# Patient Record
Sex: Male | Born: 1935 | Race: White | Hispanic: No | Marital: Married | Smoking: Never smoker
Health system: Southern US, Community
[De-identification: ages and names within clinical notes are randomized; demographics above are authoritative.]

## PROBLEM LIST (undated history)

## (undated) DIAGNOSIS — I10 Essential (primary) hypertension: Secondary | ICD-10-CM

## (undated) DIAGNOSIS — E7800 Pure hypercholesterolemia, unspecified: Secondary | ICD-10-CM

## (undated) DIAGNOSIS — K219 Gastro-esophageal reflux disease without esophagitis: Secondary | ICD-10-CM

## (undated) DIAGNOSIS — Z972 Presence of dental prosthetic device (complete) (partial): Secondary | ICD-10-CM

## (undated) DIAGNOSIS — N400 Enlarged prostate without lower urinary tract symptoms: Secondary | ICD-10-CM

## (undated) DIAGNOSIS — Z974 Presence of external hearing-aid: Secondary | ICD-10-CM

## (undated) DIAGNOSIS — L570 Actinic keratosis: Secondary | ICD-10-CM

## (undated) DIAGNOSIS — E119 Type 2 diabetes mellitus without complications: Secondary | ICD-10-CM

## (undated) HISTORY — PX: REPLACEMENT TOTAL KNEE: SUR1224

## (undated) HISTORY — DX: Actinic keratosis: L57.0

---

## 2004-08-25 ENCOUNTER — Emergency Department: Payer: Self-pay | Admitting: Emergency Medicine

## 2004-09-10 ENCOUNTER — Emergency Department: Payer: Self-pay | Admitting: Unknown Physician Specialty

## 2006-07-28 ENCOUNTER — Ambulatory Visit: Payer: Self-pay | Admitting: Gastroenterology

## 2011-11-03 ENCOUNTER — Ambulatory Visit: Payer: Self-pay | Admitting: Gastroenterology

## 2012-06-17 ENCOUNTER — Ambulatory Visit: Payer: Self-pay | Admitting: Podiatry

## 2013-01-06 ENCOUNTER — Ambulatory Visit: Payer: Self-pay | Admitting: General Practice

## 2013-01-17 ENCOUNTER — Ambulatory Visit: Payer: Self-pay | Admitting: General Practice

## 2013-02-28 ENCOUNTER — Ambulatory Visit: Payer: Self-pay | Admitting: General Practice

## 2013-02-28 DIAGNOSIS — I10 Essential (primary) hypertension: Secondary | ICD-10-CM

## 2013-02-28 LAB — MRSA PCR SCREENING

## 2013-02-28 LAB — BASIC METABOLIC PANEL
Anion Gap: 3 — ABNORMAL LOW (ref 7–16)
BUN: 18 mg/dL (ref 7–18)
Chloride: 108 mmol/L — ABNORMAL HIGH (ref 98–107)
Co2: 28 mmol/L (ref 21–32)
Creatinine: 0.99 mg/dL (ref 0.60–1.30)
EGFR (African American): >60
EGFR (Non-African Amer.): >60
Osmolality: 282 (ref 275–301)
Potassium: 4.7 mmol/L (ref 3.5–5.1)

## 2013-02-28 LAB — URINALYSIS, COMPLETE
Ketone: NEGATIVE
Nitrite: NEGATIVE
Ph: 5 (ref 4.5–8.0)
RBC,UR: 1 /HPF (ref 0–5)
Squamous Epithelial: <1

## 2013-02-28 LAB — APTT: Activated PTT: 37.1 secs — ABNORMAL HIGH (ref 23.6–35.9)

## 2013-02-28 LAB — SEDIMENTATION RATE: Erythrocyte Sed Rate: 2 mm/hr (ref 0–20)

## 2013-02-28 LAB — CBC
HGB: 16.2 g/dL (ref 13.0–18.0)
MCHC: 34.8 g/dL (ref 32.0–36.0)
Platelet: 142 10*3/uL — ABNORMAL LOW (ref 150–440)
RDW: 14.3 % (ref 11.5–14.5)

## 2013-02-28 LAB — PROTIME-INR: INR: 1

## 2013-03-07 ENCOUNTER — Inpatient Hospital Stay: Payer: Self-pay | Admitting: General Practice

## 2013-03-08 LAB — BASIC METABOLIC PANEL
BUN: 13 mg/dL (ref 7–18)
Calcium, Total: 7.9 mg/dL — ABNORMAL LOW (ref 8.5–10.1)
Chloride: 106 mmol/L (ref 98–107)
Co2: 26 mmol/L (ref 21–32)
Glucose: 117 mg/dL — ABNORMAL HIGH (ref 65–99)
Potassium: 4.3 mmol/L (ref 3.5–5.1)

## 2013-03-09 LAB — PLATELET COUNT: Platelet: 118 10*3/uL — ABNORMAL LOW (ref 150–440)

## 2013-03-09 LAB — BASIC METABOLIC PANEL
Anion Gap: 5 — ABNORMAL LOW (ref 7–16)
BUN: 11 mg/dL (ref 7–18)
Calcium, Total: 8.2 mg/dL — ABNORMAL LOW (ref 8.5–10.1)
Chloride: 110 mmol/L — ABNORMAL HIGH (ref 98–107)
Co2: 26 mmol/L (ref 21–32)
EGFR (Non-African Amer.): >60
Glucose: 127 mg/dL — ABNORMAL HIGH (ref 65–99)
Osmolality: 282 (ref 275–301)
Potassium: 4.4 mmol/L (ref 3.5–5.1)
Sodium: 141 mmol/L (ref 136–145)

## 2013-06-29 ENCOUNTER — Inpatient Hospital Stay: Payer: Self-pay | Admitting: General Practice

## 2013-06-29 LAB — CBC WITH DIFFERENTIAL/PLATELET
Basophil #: 0 10*3/uL (ref 0.0–0.1)
Basophil %: 0.2 %
EOS ABS: 0.1 10*3/uL (ref 0.0–0.7)
Eosinophil %: 1.2 %
HCT: 46 % (ref 40.0–52.0)
HGB: 15.3 g/dL (ref 13.0–18.0)
LYMPHS ABS: 0.9 10*3/uL — AB (ref 1.0–3.6)
Lymphocyte %: 11.2 %
MCH: 31.1 pg (ref 26.0–34.0)
MCHC: 33.3 g/dL (ref 32.0–36.0)
MCV: 94 fL (ref 80–100)
MONOS PCT: 7.9 %
Monocyte #: 0.7 x10 3/mm (ref 0.2–1.0)
Neutrophil #: 6.6 10*3/uL — ABNORMAL HIGH (ref 1.4–6.5)
Neutrophil %: 79.5 %
Platelet: 144 10*3/uL — ABNORMAL LOW (ref 150–440)
RBC: 4.91 10*6/uL (ref 4.40–5.90)
RDW: 15.3 % — ABNORMAL HIGH (ref 11.5–14.5)
WBC: 8.3 10*3/uL (ref 3.8–10.6)

## 2013-06-29 LAB — HEMOGLOBIN: HGB: 15.2 g/dL (ref 13.0–18.0)

## 2013-07-01 ENCOUNTER — Encounter: Payer: Self-pay | Admitting: Internal Medicine

## 2013-07-10 ENCOUNTER — Encounter: Payer: Self-pay | Admitting: Internal Medicine

## 2013-07-21 LAB — RAPID INFLUENZA A&B ANTIGENS

## 2013-07-28 LAB — HEMOGLOBIN A1C: Hemoglobin A1C: 6.2 % (ref 4.2–6.3)

## 2014-02-02 DIAGNOSIS — N400 Enlarged prostate without lower urinary tract symptoms: Secondary | ICD-10-CM | POA: Insufficient documentation

## 2014-02-02 DIAGNOSIS — E1169 Type 2 diabetes mellitus with other specified complication: Secondary | ICD-10-CM | POA: Insufficient documentation

## 2014-02-02 DIAGNOSIS — E1140 Type 2 diabetes mellitus with diabetic neuropathy, unspecified: Secondary | ICD-10-CM | POA: Insufficient documentation

## 2014-02-02 DIAGNOSIS — J3089 Other allergic rhinitis: Secondary | ICD-10-CM | POA: Insufficient documentation

## 2014-02-02 DIAGNOSIS — E785 Hyperlipidemia, unspecified: Secondary | ICD-10-CM

## 2014-02-02 DIAGNOSIS — K219 Gastro-esophageal reflux disease without esophagitis: Secondary | ICD-10-CM | POA: Insufficient documentation

## 2014-02-02 DIAGNOSIS — I10 Essential (primary) hypertension: Secondary | ICD-10-CM | POA: Insufficient documentation

## 2014-03-12 DIAGNOSIS — Z96651 Presence of right artificial knee joint: Secondary | ICD-10-CM | POA: Insufficient documentation

## 2014-07-30 DIAGNOSIS — Z79899 Other long term (current) drug therapy: Secondary | ICD-10-CM | POA: Insufficient documentation

## 2014-09-01 NOTE — Op Note (Signed)
PATIENT NAME:  Jerry Atkinson, Jerry Atkinson MR#:  [id number] DATE OF BIRTH:  [birth year]  DATE OF PROCEDURE:  03/07/2013  PREOPERATIVE DIAGNOSIS: Degenerative arthrosis of the right knee.   POSTOPERATIVE DIAGNOSIS: Degenerative arthrosis of the right knee.   PROCEDURE PERFORMED: Right total knee arthroplasty using computer-assisted navigation.   SURGEON: Skip Estimable, M.D.   ASSISTANT: Vance Peper, P.A. (required to maintain retraction throughout the procedure).   ANESTHESIA: Spinal.   ESTIMATED BLOOD LOSS: 50 mL.   FLUIDS REPLACED: 1600 mL of crystalloid.   TOURNIQUET TIME: 67 minutes.   DRAINS: Two medium drains for reinfusion system.   SOFT TISSUE RELEASES: Anterior cruciate ligament, posterior cruciate ligament, deep medial collateral ligament, and patellofemoral ligament.   IMPLANTS UTILIZED: DePuy PFC Sigma size 2.5 posterior stabilized femoral component (cemented), size 4 MBT tibial component (cemented), 32 mm three-peg oval dome patella (cemented), and a 10 mm stabilized rotating platform polyethylene insert.   INDICATIONS FOR SURGERY: The patient is an 81 year old gentleman who has been seen for complaints of progressive right knee pain. X-rays demonstrated severe degenerative change in the tricompartmental fascia with relative varus deformity. After discussion of the risks and benefits of surgical intervention, the patient expressed understanding of the risks and benefits and agreed with plans for surgical intervention.   PROCEDURE IN DETAIL: The patient was brought to the operating room and, after adequate spinal anesthesia was achieved, a tourniquet was placed on the patient's upper right thigh. The patient's right knee and leg were cleaned and prepped with alcohol and DuraPrep, draped in the usual sterile fashion. A "timeout" was performed as per usual protocol. The right lower extremity was exsanguinated using an Esmarch, and the tourniquet was inflated to 300 mmHg.   An anterior  longitudinal incision was made followed by a standard mid vastus approach. The deep fibers of the medial collateral ligament were elevated in a subperiosteal fashion off the medial flare of the tibia so as to maintain a continuous soft-tissue sleeve. The patella was subluxed laterally and the patellofemoral ligament was incised.   Inspection of the knee demonstrated severe degenerative changes with full-thickness loss of articular cartilage noted primarily along the medial compartment. Osteophytes were debrided using a rongeur. Anterior and posterior cruciate ligaments were excised. Two 4.0 mm Schanz pins were inserted into the femur and into the tibia for attachment of the array of trackers used for computer-assisted navigation.   Hip center was identified using circumduction technique. Distal landmarks were mapped using the computer. The distal femur and proximal tibia were mapped using the computer. Distal femoral cutting guide was positioned using computer-assisted navigation so as to achieve a 5-degree distal valgus cut. Cut was performed and verified using the computer. Distal femur was sized and it was felt that a size 2.5 femoral component was appropriate. A size 2.5 cutting guide was positioned and the anterior cut was performed and verified using the computer. This was followed by completion of the posterior and chamfer cuts. Femoral cutting guide for central box was then positioned, and the central box cut was performed.   Attention was then directed to the proximal tibia. Medial and lateral menisci were excised. The extramedullary tibial cutting guide was positioned using computer-assisted navigation so as to achieve a 0-degree varus valgus alignment and 0-degree posterior slope. The cut was performed and verified using the computer. The proximal tibia was sized and it was felt that a size 4 tibial tray was appropriate. Tibial and femoral trials were inserted followed by insertion of a 10  mm  polyethylene trial. Good medial and lateral soft-tissue balancing was appreciated both in full extension and in flexion.   Finally, the patella was cut and prepared so as to accommodate a 32 mm three-peg oval dome patella. Patellar trial was placed and the knee was placed through range of motion with excellent patellar tracking appreciated.   Femoral trial was removed. Central post hole for the tibial component was reamed followed by insertion of a keel punch. Tibial trials were then removed. Cut surfaces of bone were irrigated with copious amounts of normal saline with antibiotic solution using pulsatile lavage and then suctioned dry. Polymethyl methacrylate cement with gentamicin was utilized (due to the patient's history of diabetes) and mixed in the usual fashion using a vacuum mixer. Cement was applied to the cut surface of the tibia as well as along the undersurface of a size 4 MBT tibial component.   Tibial component was positioned and impacted into place. Excess cement was removed using [employer] fast streamer. Cement was then applied to the cut surface of the femur as well as along the posterior flanges of a size 2.5 posterior stabilized femoral component. Femoral component was positioned and impacted into place. Excess cement was removed using [employer] fast streamer. A 10 mm polyethylene trial was inserted and the knee was brought in full extension with steady axial compression applied. Finally, cement was applied to the backside of a 32 mm three-peg oval dome patella, and patellar component was positioned and patellar clamp applied. Excess cement was removed using [employer] fast streamer.   After adequate curing of cement, the tourniquet was deflated after a total tourniquet time of 67 minutes. Hemostasis was achieved using electrocautery. The knee was irrigated with copious amounts of normal saline with antibiotic solution using pulsatile lavage and then suctioned dry. The knee was inspected for any residual cement  debris. Then 20 mL of 1.3% Exparel in 40 mL of normal saline was injected along the posterior capsule, medial and lateral gutters, and along the arthrotomy site. A 10 mm stabilized rotating platform polyethylene insert (10 mm) was placed and the knee was placed through a range of motion. Excellent mediolateral soft-tissue balancing was appreciated both in full extension and in flexion. Excellent patellar tracking was appreciated.   Two medium drains were placed in the wound bed and brought out through a separate stab incision to be attached to reinfusion system. The medial parapatellar portion of the incision was reapproximated using interrupted sutures of #1 Vicryl. The subcutaneous tissue was approximated in layers using first #0 Vicryl followed by 2-0 Vicryl. Prior to complete closure, 30 mL of 0.25% Marcaine with epinephrine was injected along the incision line in the subcutaneous tissue. Skin was then approximated using skin staples. Sterile dressing was applied.   The patient tolerated the procedure well. He was transported to the recovery room in stable condition.   ____________________________ Laurice Record. Holley Bouche., MD jph:np D: 03/07/2013 13:39:05 ET T: 03/07/2013 20:56:18 ET JOB#: [id number]  cc: Laurice Record. Holley Bouche., MD, <Dictator> JAMES P Holley Bouche MD ELECTRONICALLY SIGNED 03/13/2013 11:08

## 2014-09-01 NOTE — Discharge Summary (Signed)
PATIENT NAME:  Jerry Atkinson, Jerry Atkinson MR#:  [id number] DATE OF BIRTH:  01-18-1931  DATE OF ADMISSION:  03/07/2013 DATE OF DISCHARGE: 03/10/2013   ADMITTING DIAGNOSIS:  Degenerative arthrosis of the right knee.   DISCHARGE DIAGNOSIS:  Degenerative arthrosis of the right knee.   HISTORY OF PRESENT ILLNESS:  The patient is an 81 year old gentleman who has been followed at [facility] for progression of right knee pain.  The patient had reported a long history of progressive right knee pain.  The pain was noted to be aggravated with activities such as gardening which he had done over the summer prior to the surgery.  He was occasionally having some night pain.  The patient also reported some crepitus as well as start-up stiffness.  He had noticed only modest improvement of his symptoms following a series of Synvisc injections.  The patient had localized most of the pain along the medial aspect at the joint line.  The patient states that the pain had progressed to the point that it was significantly interfering with his activities of daily living.  X-rays taken in [facility] showed narrowing of the medial cartilage space with associated varus alignment.  He was noted to have subchondral sclerosis as well as some osteophyte formation.  There was degenerative changes to the patellofemoral articulation.  After discussion of the risks and benefits of surgical intervention, the patient expressed his understanding of the risks and benefits and agreed for plans for surgical intervention.   PROCEDURE:  Right total knee arthroplasty using computer-assisted navigation.   ANESTHESIA:  Spinal.   SOFT TISSUE RELEASE:  Anterior cruciate ligament, posterior cruciate ligament, deep medial collateral ligaments as well as patellofemoral ligament.   IMPLANTS UTILIZED:  DePuy PFC Sigma size 2.5 posterior stabilized femoral component (cemented), size four MBT tibial component (cemented), 32 mm three pegged oval  dome patella (cemented), and a 10 mm stabilized rotating platform polyethylene insert.   HOSPITAL COURSE:  The patient tolerated the procedure very well.  He had no complications.  He was then taken to the PACU where he was stabilized and then transferred to the orthopedic floor.  The patient began receiving anticoagulation therapy of Lovenox 30 mg subQ q. 12 hours per anesthesia and pharmacy protocol.  He was fitted with Ted stockings bilaterally.  These were allowed to be removed one hour per eight hour shift.  The right one was applied on day two following removal of the Hemovac and dressing change.  The patient was also fitted with the AV-I compression foot pumps bilaterally set at 80 mmHg bilaterally.  The patient has denied any pain to the calf.  There have been no evidence of any DVTs.  Negative Homans sign.  Heels were elevated off the bed using rolled towels.   The patient has denied any chest pain or shortness of breath.  Vital signs have been stable.  He has been afebrile.  Hemodynamically he was stable.  No transfusions were given other than the Autovac transfusions given in the first six hours postoperatively.   Physical therapy was initiated on day one for gait training and transfers.  He has done very well.  Upon being discharged was ambulating greater than 450 feet.  He was able to go up and down four sets of steps.  He was independent with bed to chair transfers.  Range of motion was full extension with flexion being 105 to 110.  Occupational therapy was also initiated on day one for ADL and assistive devices.  The patient's IV, Foley and Hemovac were discontinued on day 2 along with the dressing change.  Polar Care was reapplied maintaining a temperature of 40 to 50 degrees Fahrenheit.  There was no signs of any infection.    The patient is being discharged to home in improved stable condition.  He is to continue weight-bearing as tolerated.  Continue using a walker until cleared by  physical therapy to go to a quad cane.  Continue Ted stockings.  These are to be worn bilaterally during the day.  May remove at night.  Continue Polar Care maintaining a temperature of 40 to 50 degrees Fahrenheit.  Recommend he try to wear this around-the-clock for the first two weeks.  He has a follow-up appointment at [facility] on November the 11th at 1:15.  He is to call the clinic sooner if any temperatures of 101.5 or greater or excessive bleeding.  He is to elevate his heels off the bed.  Recommend that he continue using his incentive spirometer q. 1 hour while awake.  Encouraged cough, deep breathing q. two hours while awake.  Change dressing as needed.  The patient is not to take a shower until the staples are removed.  The patient was given a prescription for oxycodone 5 to 10 mg q. 4 to 6 hours as needed for pain, Tramadol 50 to 100 mg q. 4 to 6 hours as needed for pain and Lovenox 40 mg subQ daily for 14 days, then discontinue and begin taking one 81 mg enteric-coated aspirin.   PAST MEDICAL HISTORY: 1.  Hypertension.  2.  History of myasthenia gravis.  3.  Gastroesophageal reflux disease.  4.  Hypercholesterolemia.  5.  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     ____________________________ Vance Peper, PA jrw:ea D: 03/10/2013 06:47:41 ET T: 03/10/2013 07:02:13 ET JOB#: [id number]  cc: Vance Peper, PA, <Dictator> JON WOLFE PA ELECTRONICALLY SIGNED 03/12/2013 20:52

## 2014-09-02 NOTE — H&P (Signed)
PATIENT NAME:  Jerry Atkinson, Jerry Atkinson MR#:  [id number] DATE OF BIRTH:  Sep 26, 1930  DATE OF ADMISSION:  06/29/2013  CONSULTING PHYSICIAN: Jeneen Rinks P. Holley Bouche., M.D.   CHIEF COMPLAINT: Right knee pain.   HISTORY OF PRESENT ILLNESS: The patient is an 81 year old gentleman who slipped and fell on the ice and snow earlier today, apparently landing on his right knee and leg. He had significant pain with any attempt at weight-bearing activity and noted swelling to the knee and lower leg. He subsequently presented to [facility] Emergency Room, at which time x-rays demonstrated a periprosthetic fracture of the right proximal tibia. He denied any loss of consciousness. He denied any other injuries. Pain was a significant factor and it was felt that both mobilization as well as cold therapy, neurovascular checks and pain management were significant grounds for admission.   PAST MEDICAL HISTORY: Hypertension, history of myasthenia gravis, gastroesophageal reflux disease, hypercholesterolemia, mild to moderate mitral regurgitation with tricuspid regurgitation, diabetes.   PAST SURGICAL HISTORY:  1.  Open reduction and internal fixation of right femur fracture.  2.  Right total knee arthroplasty using computer-assisted navigation (03/07/2013).   ALLERGIES: No known drug allergies.   MEDICATIONS: Ramipril 10 mg daily, simvastatin 20 mg every night, metoprolol tartrate 25 mg b.i.d., ranitidine 150 mg twice a day, glimepiride 1 mg daily, tamsulosin 0.4 mg daily.   SOCIAL HISTORY: The patient denies any tobacco or alcohol use. He denies any illicit drug use. He is retired. He has sons who live locally.   FAMILY HISTORY: Positive for diabetes and dementia in mother and CVA in father.   REVIEW OF SYSTEMS: Pertinent review of systems is unremarkable for any orthopedic or neurologic symptoms except as noted above. The patient denies any fevers, chills or constitutional symptoms.   PHYSICAL EXAMINATION: GENERAL:  Well-developed, well-nourished male seen in obvious discomfort. He is unable to stand or bear weight on the right lower extremity.  HEENT: Atraumatic, normocephalic. Sclerae clear. Extraocular motion is intact. Oropharynx is clear with moist mucosa. Neck is supple with good range of motion.  LUNGS: Clear to auscultation bilaterally.  CARDIAC: Regular rate and rhythm with normal S1, S2. No gallops or rubs. No significant ankle edema.  ABDOMEN: Soft, nontender, nondistended.  MUSCULOSKELETAL: Good range of motion and strength to the upper extremities. Examination of the lower extremities is notable for well-healed surgical incision to the anterior aspect of the right knee. Significant amount of swelling is noted extending from the knee into the calf. No gross ecchymosis. Homans test is negative. Range of motion was not assessed due to the injury. Good quadriceps tone was appreciated. No palpable defect along the patellar tendon or quadriceps tendon.  NEUROLOGIC: Awake, alert and oriented. Sensory function is grossly intact throughout. Motor strength is felt to be 5/5 with the exception of the right lower extremity which was not assessed due to the injury. Good motor coordination is appreciated.   X-RAYS: AP, lateral and oblique radiographs of the right knee were reviewed. The total knee implants appear to be intact without evidence of loosening. There is a minimally displaced fracture on the metaphysis of the proximal tibia most notably to the medial aspect. Good alignment is noted on both AP and lateral planes.   IMPRESSION:  1.  Right periprosthetic proximal tibia fracture.  2.  Status post right total knee arthroplasty.   PLAN: Findings were discussed in detail with the patient and his son. The patient was fitted with range of motion brace locked in extension.  Polar Care was applied. Physical therapy and occupational therapy will be consulted to work on transfers. The patient is to remain  nonweightbearing or toe-touch weight-bearing. Possible need for surgical intervention was discussed with the patient. We will continue with pain control measures as well as cold therapy so as to limit the degree of swelling. The patient will be monitored for any signs of compartment syndrome.     ____________________________ Laurice Record. Holley Bouche., MD jph:dmm D: 06/29/2013 21:30:42 ET T: 06/29/2013 22:04:40 ET JOB#: [id number]  cc: Laurice Record. Holley Bouche., MD, <Dictator> Laurice Record Holley Bouche MD ELECTRONICALLY SIGNED 07/03/2013 21:59

## 2015-06-27 DIAGNOSIS — H2513 Age-related nuclear cataract, bilateral: Secondary | ICD-10-CM | POA: Diagnosis not present

## 2015-07-09 DIAGNOSIS — H1033 Unspecified acute conjunctivitis, bilateral: Secondary | ICD-10-CM | POA: Diagnosis not present

## 2015-08-01 DIAGNOSIS — I10 Essential (primary) hypertension: Secondary | ICD-10-CM | POA: Diagnosis not present

## 2015-08-01 DIAGNOSIS — E782 Mixed hyperlipidemia: Secondary | ICD-10-CM | POA: Diagnosis not present

## 2015-08-01 DIAGNOSIS — Z79899 Other long term (current) drug therapy: Secondary | ICD-10-CM | POA: Diagnosis not present

## 2015-08-01 DIAGNOSIS — R270 Ataxia, unspecified: Secondary | ICD-10-CM | POA: Diagnosis not present

## 2015-08-01 DIAGNOSIS — N401 Enlarged prostate with lower urinary tract symptoms: Secondary | ICD-10-CM | POA: Diagnosis not present

## 2015-08-01 DIAGNOSIS — K219 Gastro-esophageal reflux disease without esophagitis: Secondary | ICD-10-CM | POA: Diagnosis not present

## 2015-08-01 DIAGNOSIS — E119 Type 2 diabetes mellitus without complications: Secondary | ICD-10-CM | POA: Diagnosis not present

## 2015-08-02 DIAGNOSIS — E119 Type 2 diabetes mellitus without complications: Secondary | ICD-10-CM | POA: Diagnosis not present

## 2015-08-02 DIAGNOSIS — E782 Mixed hyperlipidemia: Secondary | ICD-10-CM | POA: Diagnosis not present

## 2015-08-02 DIAGNOSIS — R270 Ataxia, unspecified: Secondary | ICD-10-CM | POA: Diagnosis not present

## 2015-08-02 DIAGNOSIS — K219 Gastro-esophageal reflux disease without esophagitis: Secondary | ICD-10-CM | POA: Diagnosis not present

## 2015-08-02 DIAGNOSIS — Z79899 Other long term (current) drug therapy: Secondary | ICD-10-CM | POA: Diagnosis not present

## 2015-08-02 DIAGNOSIS — I10 Essential (primary) hypertension: Secondary | ICD-10-CM | POA: Diagnosis not present

## 2015-08-02 DIAGNOSIS — N401 Enlarged prostate with lower urinary tract symptoms: Secondary | ICD-10-CM | POA: Diagnosis not present

## 2015-08-10 DIAGNOSIS — H2513 Age-related nuclear cataract, bilateral: Secondary | ICD-10-CM | POA: Diagnosis not present

## 2015-08-31 DIAGNOSIS — H2513 Age-related nuclear cataract, bilateral: Secondary | ICD-10-CM | POA: Diagnosis not present

## 2015-09-03 ENCOUNTER — Encounter: Payer: Self-pay | Admitting: *Deleted

## 2015-09-06 DIAGNOSIS — R29898 Other symptoms and signs involving the musculoskeletal system: Secondary | ICD-10-CM | POA: Diagnosis not present

## 2015-09-06 DIAGNOSIS — Z8669 Personal history of other diseases of the nervous system and sense organs: Secondary | ICD-10-CM | POA: Insufficient documentation

## 2015-09-06 DIAGNOSIS — E1142 Type 2 diabetes mellitus with diabetic polyneuropathy: Secondary | ICD-10-CM | POA: Insufficient documentation

## 2015-09-06 DIAGNOSIS — E538 Deficiency of other specified B group vitamins: Secondary | ICD-10-CM | POA: Insufficient documentation

## 2015-09-06 DIAGNOSIS — R278 Other lack of coordination: Secondary | ICD-10-CM | POA: Diagnosis not present

## 2015-09-07 NOTE — Discharge Instructions (Signed)

## 2015-09-10 ENCOUNTER — Ambulatory Visit: Payer: PPO | Admitting: Anesthesiology

## 2015-09-10 ENCOUNTER — Ambulatory Visit
Admission: RE | Admit: 2015-09-10 | Discharge: 2015-09-10 | Disposition: A | Discharge status: Home / Self Care | Payer: PPO | Source: Ambulatory Visit | Attending: Ophthalmology | Admitting: Ophthalmology

## 2015-09-10 ENCOUNTER — Encounter
Admission: RE | Disposition: A | Discharge status: Home / Self Care | Payer: Self-pay | Source: Ambulatory Visit | Attending: Ophthalmology

## 2015-09-10 DIAGNOSIS — I10 Essential (primary) hypertension: Secondary | ICD-10-CM | POA: Insufficient documentation

## 2015-09-10 DIAGNOSIS — E7800 Pure hypercholesterolemia, unspecified: Secondary | ICD-10-CM | POA: Insufficient documentation

## 2015-09-10 DIAGNOSIS — Z96659 Presence of unspecified artificial knee joint: Secondary | ICD-10-CM | POA: Insufficient documentation

## 2015-09-10 DIAGNOSIS — H2511 Age-related nuclear cataract, right eye: Secondary | ICD-10-CM | POA: Insufficient documentation

## 2015-09-10 DIAGNOSIS — K219 Gastro-esophageal reflux disease without esophagitis: Secondary | ICD-10-CM | POA: Diagnosis not present

## 2015-09-10 DIAGNOSIS — H2513 Age-related nuclear cataract, bilateral: Secondary | ICD-10-CM | POA: Diagnosis not present

## 2015-09-10 DIAGNOSIS — N400 Enlarged prostate without lower urinary tract symptoms: Secondary | ICD-10-CM | POA: Insufficient documentation

## 2015-09-10 HISTORY — DX: Pure hypercholesterolemia, unspecified: E78.00

## 2015-09-10 HISTORY — DX: Essential (primary) hypertension: I10

## 2015-09-10 HISTORY — DX: Gastro-esophageal reflux disease without esophagitis: K21.9

## 2015-09-10 HISTORY — PX: CATARACT EXTRACTION W/PHACO: SHX586

## 2015-09-10 HISTORY — DX: Benign prostatic hyperplasia without lower urinary tract symptoms: N40.0

## 2015-09-10 SURGERY — PHACOEMULSIFICATION, CATARACT, WITH IOL INSERTION
Anesthesia: Monitor Anesthesia Care | Laterality: Right | Wound class: Clean

## 2015-09-10 MED ORDER — BRIMONIDINE TARTRATE 0.2 % OP SOLN
OPHTHALMIC | Status: DC | PRN
  Administered 2015-09-10: 1 [drp] via OPHTHALMIC

## 2015-09-10 MED ORDER — ARMC OPHTHALMIC DILATING GEL
1.0000 "application " | OPHTHALMIC | Status: DC | PRN
  Administered 2015-09-10 (×2): 1 via OPHTHALMIC

## 2015-09-10 MED ORDER — ACETAMINOPHEN 325 MG PO TABS
325.0000 mg | ORAL_TABLET | ORAL | Status: DC | PRN
Start: 2015-09-10 — End: 2015-09-10

## 2015-09-10 MED ORDER — BSS IO SOLN
INTRAOCULAR | Status: DC | PRN
  Administered 2015-09-10: 85 mL

## 2015-09-10 MED ORDER — TIMOLOL MALEATE 0.5 % OP SOLN
OPHTHALMIC | Status: DC | PRN
  Administered 2015-09-10: 1 [drp] via OPHTHALMIC

## 2015-09-10 MED ORDER — FENTANYL CITRATE (PF) 100 MCG/2ML IJ SOLN
INTRAMUSCULAR | Status: DC | PRN
  Administered 2015-09-10: 50 ug via INTRAVENOUS

## 2015-09-10 MED ORDER — LACTATED RINGERS IV SOLN: INTRAVENOUS | Status: DC

## 2015-09-10 MED ORDER — MIDAZOLAM HCL 2 MG/2ML IJ SOLN
INTRAMUSCULAR | Status: DC | PRN
  Administered 2015-09-10: 2 mg via INTRAVENOUS

## 2015-09-10 MED ORDER — NA HYALUR & NA CHOND-NA HYALUR 0.4-0.35 ML IO KIT
PACK | INTRAOCULAR | Status: DC | PRN
  Administered 2015-09-10: 1 mL via INTRAOCULAR

## 2015-09-10 MED ORDER — CEFUROXIME OPHTHALMIC INJECTION 1 MG/0.1 ML
INJECTION | OPHTHALMIC | Status: DC | PRN
  Administered 2015-09-10: 0.1 mL via INTRACAMERAL

## 2015-09-10 MED ORDER — POVIDONE-IODINE 5 % OP SOLN
1.0000 "application " | OPHTHALMIC | Status: DC | PRN
  Administered 2015-09-10: 1 via OPHTHALMIC

## 2015-09-10 MED ORDER — LIDOCAINE HCL (PF) 4 % IJ SOLN
INTRAOCULAR | Status: DC | PRN
  Administered 2015-09-10: 1 mL via OPHTHALMIC

## 2015-09-10 MED ORDER — TETRACAINE HCL 0.5 % OP SOLN
1.0000 [drp] | OPHTHALMIC | Status: DC | PRN
  Administered 2015-09-10: 1 [drp] via OPHTHALMIC

## 2015-09-10 MED ORDER — ACETAMINOPHEN 160 MG/5ML PO SOLN: 325.0000 mg | ORAL | Status: DC | PRN

## 2015-09-10 SURGICAL SUPPLY — 28 items
APPLICATOR COTTON TIP 3IN (MISCELLANEOUS) ×3 IMPLANT
CANNULA ANT/CHMB 27GA (MISCELLANEOUS) ×3 IMPLANT
DISSECTOR HYDRO NUCLEUS 50X22 (MISCELLANEOUS) ×3 IMPLANT
GLOVE BIO SURGEON STRL SZ7 (GLOVE) ×3 IMPLANT
GLOVE SURG LX 6.5 MICRO (GLOVE) ×2
GLOVE SURG LX STRL 6.5 MICRO (GLOVE) ×1 IMPLANT
GOWN STRL REUS W/ TWL LRG LVL3 (GOWN DISPOSABLE) ×2 IMPLANT
GOWN STRL REUS W/TWL LRG LVL3 (GOWN DISPOSABLE) ×4
LENS IOL ACRYSOF IQ 21.5 (Intraocular Lens) ×3 IMPLANT
MARKER SKIN DUAL TIP RULER LAB (MISCELLANEOUS) ×3 IMPLANT
NEEDLE FILTER BLUNT 18X 1/2SAF (NEEDLE) ×6
NEEDLE FILTER BLUNT 18X1 1/2 (NEEDLE) ×3 IMPLANT
PACK CATARACT BRASINGTON (MISCELLANEOUS) ×3 IMPLANT
PACK EYE AFTER SURG (MISCELLANEOUS) ×3 IMPLANT
PACK OPTHALMIC (MISCELLANEOUS) ×3 IMPLANT
RING MALYGIN 7.0 (MISCELLANEOUS) ×3 IMPLANT
SOL BAL SALT 15ML (MISCELLANEOUS)
SOLUTION BAL SALT 15ML (MISCELLANEOUS) IMPLANT
SUT ETHILON 10-0 CS-B-6CS-B-6 (SUTURE)
SUT VICRYL  9 0 (SUTURE)
SUT VICRYL 9 0 (SUTURE) IMPLANT
SUTURE EHLN 10-0 CS-B-6CS-B-6 (SUTURE) IMPLANT
SYR 3ML LL SCALE MARK (SYRINGE) ×3 IMPLANT
SYR TB 1ML LUER SLIP (SYRINGE) ×3 IMPLANT
WATER STERILE IRR 250ML POUR (IV SOLUTION) ×3 IMPLANT
WATER STERILE IRR 500ML POUR (IV SOLUTION) IMPLANT
WICK EYE OCUCEL (MISCELLANEOUS) IMPLANT
WIPE NON LINTING 3.25X3.25 (MISCELLANEOUS) ×3 IMPLANT

## 2015-09-10 NOTE — Transfer of Care (Signed)
Immediate Anesthesia Transfer of Care Note  Patient: Jerry Atkinson  Procedure(s) Performed: Procedure(s) with comments: CATARACT EXTRACTION PHACO AND INTRAOCULAR LENS PLACEMENT ([place]) (Right) - [place]  Patient Location: PACU  Anesthesia Type: MAC  Level of Consciousness: awake, alert  and patient cooperative  Airway and Oxygen Therapy: Patient Spontanous Breathing and Patient connected to supplemental oxygen  Post-op Assessment: Post-op Vital signs reviewed, Patient's Cardiovascular Status Stable, Respiratory Function Stable, Patent Airway and No signs of Nausea or vomiting  Post-op Vital Signs: Reviewed and stable  Complications: No apparent anesthesia complications

## 2015-09-10 NOTE — Anesthesia Procedure Notes (Signed)
Procedure Name: MAC Performed by: Jacquelene Kopecky Pre-anesthesia Checklist: Patient identified, Emergency Drugs available, Suction available, Timeout performed and Patient being monitored Patient Re-evaluated:Patient Re-evaluated prior to inductionOxygen Delivery Method: Nasal cannula Placement Confirmation: positive ETCO2     

## 2015-09-10 NOTE — Anesthesia Preprocedure Evaluation (Signed)
Anesthesia Evaluation  Patient identified by MRN, date of birth, ID band  Reviewed: Allergy & Precautions, H&P , NPO status , Patient's Chart, lab work & pertinent test results  Airway Mallampati: II  TM Distance: >3 FB Neck ROM: full    Dental no notable dental hx. (+) Upper Dentures   Pulmonary    Pulmonary exam normal        Cardiovascular hypertension,  Rhythm:regular Rate:Normal     Neuro/Psych    GI/Hepatic GERD  ,  Endo/Other    Renal/GU      Musculoskeletal   Abdominal   Peds  Hematology   Anesthesia Other Findings   Reproductive/Obstetrics                             Anesthesia Physical Anesthesia Plan  ASA: II  Anesthesia Plan: MAC   Post-op Pain Management:    Induction:   Airway Management Planned:   Additional Equipment:   Intra-op Plan:   Post-operative Plan:   Informed Consent: I have reviewed the patients History and Physical, chart, labs and discussed the procedure including the risks, benefits and alternatives for the proposed anesthesia with the patient or authorized representative who has indicated his/her understanding and acceptance.     Plan Discussed with: CRNA  Anesthesia Plan Comments:         Anesthesia Quick Evaluation

## 2015-09-10 NOTE — H&P (Signed)
H+P reviewed and is up to date, please see paper chart.  

## 2015-09-10 NOTE — Anesthesia Postprocedure Evaluation (Signed)
Anesthesia Post Note  Patient: Jerry Atkinson  Procedure(s) Performed: Procedure(s) (LRB): CATARACT EXTRACTION PHACO AND INTRAOCULAR LENS PLACEMENT ([facility]) (Right)  Patient location during evaluation: PACU Anesthesia Type: MAC Level of consciousness: awake and alert and oriented Pain management: satisfactory to patient Vital Signs Assessment: post-procedure vital signs reviewed and stable Respiratory status: spontaneous breathing, nonlabored ventilation and respiratory function stable Cardiovascular status: blood pressure returned to baseline and stable Postop Assessment: Adequate PO intake and No signs of nausea or vomiting Anesthetic complications: no    [name]

## 2015-09-10 NOTE — Op Note (Signed)
Date of Surgery: 09/10/2015  PREOPERATIVE DIAGNOSES: Visually significant nuclear sclerotic cataract, right eye.  POSTOPERATIVE DIAGNOSES: Same  PROCEDURES PERFORMED: Cataract extraction with intraocular lens implant, right eye, with the aid of Malyugin ring.  SURGEON: Almon Hercules, M.D.  ANESTHESIA: MAC and topical  IMPLANTS: [id number] +21.5 D   Implant Name Type Inv. Item Serial No. Manufacturer Lot No. LRB No. Used  Acrasof IQ iol Intraocular Lens   [id number] [employer]   Right 1     COMPLICATIONS: None.  DESCRIPTION OF PROCEDURE: Therapeutic options were discussed with the patient preoperatively, including a discussion of risks and benefits of surgery. Informed consent was obtained. An IOL-Master and immersion biometry were used to take the lens measurements, and a dilated fundus exam was performed within 6 months of the surgical date.  The patient was premedicated and brought to the operating room and placed on the operating table in the supine position. After adequate anesthesia, the patient was prepped and draped in the usual sterile ophthalmic fashion. A wire lid speculum was inserted and the microscope was positioned. A Superblade was used to create a paracentesis site at the limbus and a small amount of dilute preservative free lidocaine was instilled into the anterior chamber, followed by dispersive viscoelastic. The pupil, no larger than 5mm, required dilation with a Malyugin ring.  A clear corneal incision was created temporally using a 2.4 mm keratome blade, and then the Malyugin ring was placed. Capsulorrhexis was then performed. In situ phacoemulsification was performed.  Cortical material was removed with the irrigation-aspiration unit. Dispersive viscoelastic was instilled to open the capsular bag. A posterior chamber intraocular lens with the specifications above was inserted and positioned. The Malyugin ring was removed.  Irrigation-aspiration was used to remove all  viscoelastic. Cefuroxime 1cc was instilled into the anterior chamber, and the corneal incision was checked and found to be water tight. The eyelid speculum was removed.  The operative eye was covered with protective goggles after instilling 1 drop of timolol and brimonidine. The patient tolerated the procedure well. There were no complications.

## 2015-09-11 ENCOUNTER — Encounter: Payer: Self-pay | Admitting: Ophthalmology

## 2015-09-27 DIAGNOSIS — J3089 Other allergic rhinitis: Secondary | ICD-10-CM | POA: Diagnosis not present

## 2015-09-27 DIAGNOSIS — J019 Acute sinusitis, unspecified: Secondary | ICD-10-CM | POA: Diagnosis not present

## 2015-10-18 DIAGNOSIS — E538 Deficiency of other specified B group vitamins: Secondary | ICD-10-CM | POA: Diagnosis not present

## 2015-10-18 DIAGNOSIS — R278 Other lack of coordination: Secondary | ICD-10-CM | POA: Diagnosis not present

## 2016-02-04 DIAGNOSIS — N401 Enlarged prostate with lower urinary tract symptoms: Secondary | ICD-10-CM | POA: Diagnosis not present

## 2016-02-04 DIAGNOSIS — Z23 Encounter for immunization: Secondary | ICD-10-CM | POA: Diagnosis not present

## 2016-02-04 DIAGNOSIS — E538 Deficiency of other specified B group vitamins: Secondary | ICD-10-CM | POA: Diagnosis not present

## 2016-02-04 DIAGNOSIS — R351 Nocturia: Secondary | ICD-10-CM | POA: Diagnosis not present

## 2016-02-04 DIAGNOSIS — Z79899 Other long term (current) drug therapy: Secondary | ICD-10-CM | POA: Diagnosis not present

## 2016-02-04 DIAGNOSIS — K219 Gastro-esophageal reflux disease without esophagitis: Secondary | ICD-10-CM | POA: Diagnosis not present

## 2016-02-04 DIAGNOSIS — E1140 Type 2 diabetes mellitus with diabetic neuropathy, unspecified: Secondary | ICD-10-CM | POA: Diagnosis not present

## 2016-02-04 DIAGNOSIS — M170 Bilateral primary osteoarthritis of knee: Secondary | ICD-10-CM | POA: Diagnosis not present

## 2016-02-04 DIAGNOSIS — I10 Essential (primary) hypertension: Secondary | ICD-10-CM | POA: Diagnosis not present

## 2016-02-04 DIAGNOSIS — E782 Mixed hyperlipidemia: Secondary | ICD-10-CM | POA: Diagnosis not present

## 2016-02-26 DIAGNOSIS — E875 Hyperkalemia: Secondary | ICD-10-CM | POA: Diagnosis not present

## 2016-04-01 DIAGNOSIS — E538 Deficiency of other specified B group vitamins: Secondary | ICD-10-CM | POA: Diagnosis not present

## 2016-04-01 DIAGNOSIS — I10 Essential (primary) hypertension: Secondary | ICD-10-CM | POA: Diagnosis not present

## 2016-04-01 DIAGNOSIS — E1140 Type 2 diabetes mellitus with diabetic neuropathy, unspecified: Secondary | ICD-10-CM | POA: Diagnosis not present

## 2016-04-01 DIAGNOSIS — R351 Nocturia: Secondary | ICD-10-CM | POA: Diagnosis not present

## 2016-04-01 DIAGNOSIS — Z79899 Other long term (current) drug therapy: Secondary | ICD-10-CM | POA: Diagnosis not present

## 2016-04-01 DIAGNOSIS — M170 Bilateral primary osteoarthritis of knee: Secondary | ICD-10-CM | POA: Diagnosis not present

## 2016-04-01 DIAGNOSIS — E782 Mixed hyperlipidemia: Secondary | ICD-10-CM | POA: Diagnosis not present

## 2016-04-01 DIAGNOSIS — N401 Enlarged prostate with lower urinary tract symptoms: Secondary | ICD-10-CM | POA: Diagnosis not present

## 2016-04-10 DIAGNOSIS — M1712 Unilateral primary osteoarthritis, left knee: Secondary | ICD-10-CM | POA: Diagnosis not present

## 2016-04-10 DIAGNOSIS — M25562 Pain in left knee: Secondary | ICD-10-CM | POA: Diagnosis not present

## 2016-04-10 DIAGNOSIS — Z96651 Presence of right artificial knee joint: Secondary | ICD-10-CM | POA: Diagnosis not present

## 2016-04-25 DIAGNOSIS — H2512 Age-related nuclear cataract, left eye: Secondary | ICD-10-CM | POA: Diagnosis not present

## 2016-07-31 DIAGNOSIS — E782 Mixed hyperlipidemia: Secondary | ICD-10-CM | POA: Diagnosis not present

## 2016-07-31 DIAGNOSIS — E1140 Type 2 diabetes mellitus with diabetic neuropathy, unspecified: Secondary | ICD-10-CM | POA: Diagnosis not present

## 2016-07-31 DIAGNOSIS — J069 Acute upper respiratory infection, unspecified: Secondary | ICD-10-CM | POA: Diagnosis not present

## 2016-07-31 DIAGNOSIS — Z79899 Other long term (current) drug therapy: Secondary | ICD-10-CM | POA: Diagnosis not present

## 2016-07-31 DIAGNOSIS — R351 Nocturia: Secondary | ICD-10-CM | POA: Diagnosis not present

## 2016-07-31 DIAGNOSIS — M170 Bilateral primary osteoarthritis of knee: Secondary | ICD-10-CM | POA: Diagnosis not present

## 2016-07-31 DIAGNOSIS — N401 Enlarged prostate with lower urinary tract symptoms: Secondary | ICD-10-CM | POA: Diagnosis not present

## 2016-07-31 DIAGNOSIS — I10 Essential (primary) hypertension: Secondary | ICD-10-CM | POA: Diagnosis not present

## 2016-07-31 DIAGNOSIS — E538 Deficiency of other specified B group vitamins: Secondary | ICD-10-CM | POA: Diagnosis not present

## 2016-11-07 DIAGNOSIS — Z87442 Personal history of urinary calculi: Secondary | ICD-10-CM | POA: Diagnosis not present

## 2016-11-07 DIAGNOSIS — N401 Enlarged prostate with lower urinary tract symptoms: Secondary | ICD-10-CM | POA: Diagnosis not present

## 2016-11-07 DIAGNOSIS — N3941 Urge incontinence: Secondary | ICD-10-CM | POA: Diagnosis not present

## 2016-11-07 DIAGNOSIS — N138 Other obstructive and reflux uropathy: Secondary | ICD-10-CM | POA: Diagnosis not present

## 2016-11-07 DIAGNOSIS — R339 Retention of urine, unspecified: Secondary | ICD-10-CM | POA: Diagnosis not present

## 2016-11-10 DIAGNOSIS — N3941 Urge incontinence: Secondary | ICD-10-CM | POA: Insufficient documentation

## 2016-11-10 DIAGNOSIS — N138 Other obstructive and reflux uropathy: Secondary | ICD-10-CM | POA: Insufficient documentation

## 2016-11-10 DIAGNOSIS — Z87442 Personal history of urinary calculi: Secondary | ICD-10-CM | POA: Insufficient documentation

## 2016-11-10 DIAGNOSIS — R339 Retention of urine, unspecified: Secondary | ICD-10-CM | POA: Insufficient documentation

## 2016-11-10 DIAGNOSIS — N401 Enlarged prostate with lower urinary tract symptoms: Secondary | ICD-10-CM

## 2016-12-05 ENCOUNTER — Other Ambulatory Visit: Payer: Self-pay | Admitting: Otolaryngology

## 2016-12-05 DIAGNOSIS — R42 Dizziness and giddiness: Secondary | ICD-10-CM

## 2016-12-05 DIAGNOSIS — H90A31 Mixed conductive and sensorineural hearing loss, unilateral, right ear with restricted hearing on the contralateral side: Secondary | ICD-10-CM | POA: Diagnosis not present

## 2016-12-05 DIAGNOSIS — H903 Sensorineural hearing loss, bilateral: Secondary | ICD-10-CM | POA: Diagnosis not present

## 2016-12-05 DIAGNOSIS — H6122 Impacted cerumen, left ear: Secondary | ICD-10-CM | POA: Diagnosis not present

## 2016-12-15 ENCOUNTER — Ambulatory Visit
Admission: RE | Admit: 2016-12-15 | Discharge: 2016-12-15 | Disposition: A | Discharge status: Home / Self Care | Payer: PPO | Source: Ambulatory Visit | Attending: Otolaryngology | Admitting: Otolaryngology

## 2016-12-15 DIAGNOSIS — R42 Dizziness and giddiness: Secondary | ICD-10-CM | POA: Insufficient documentation

## 2016-12-15 DIAGNOSIS — I6523 Occlusion and stenosis of bilateral carotid arteries: Secondary | ICD-10-CM | POA: Diagnosis not present

## 2016-12-15 DIAGNOSIS — I499 Cardiac arrhythmia, unspecified: Secondary | ICD-10-CM | POA: Diagnosis not present

## 2016-12-23 DIAGNOSIS — I6523 Occlusion and stenosis of bilateral carotid arteries: Secondary | ICD-10-CM | POA: Insufficient documentation

## 2016-12-23 DIAGNOSIS — I499 Cardiac arrhythmia, unspecified: Secondary | ICD-10-CM | POA: Diagnosis not present

## 2016-12-23 DIAGNOSIS — I447 Left bundle-branch block, unspecified: Secondary | ICD-10-CM | POA: Diagnosis not present

## 2016-12-23 DIAGNOSIS — I491 Atrial premature depolarization: Secondary | ICD-10-CM | POA: Diagnosis not present

## 2017-01-02 DIAGNOSIS — E1140 Type 2 diabetes mellitus with diabetic neuropathy, unspecified: Secondary | ICD-10-CM | POA: Diagnosis not present

## 2017-01-02 DIAGNOSIS — I6523 Occlusion and stenosis of bilateral carotid arteries: Secondary | ICD-10-CM | POA: Diagnosis not present

## 2017-01-02 DIAGNOSIS — I10 Essential (primary) hypertension: Secondary | ICD-10-CM | POA: Diagnosis not present

## 2017-01-02 DIAGNOSIS — E782 Mixed hyperlipidemia: Secondary | ICD-10-CM | POA: Diagnosis not present

## 2017-01-02 DIAGNOSIS — R002 Palpitations: Secondary | ICD-10-CM | POA: Diagnosis not present

## 2017-01-02 DIAGNOSIS — R079 Chest pain, unspecified: Secondary | ICD-10-CM | POA: Diagnosis not present

## 2017-01-19 DIAGNOSIS — R079 Chest pain, unspecified: Secondary | ICD-10-CM | POA: Diagnosis not present

## 2017-01-30 DIAGNOSIS — E782 Mixed hyperlipidemia: Secondary | ICD-10-CM | POA: Diagnosis not present

## 2017-01-30 DIAGNOSIS — I10 Essential (primary) hypertension: Secondary | ICD-10-CM | POA: Diagnosis not present

## 2017-01-30 DIAGNOSIS — I480 Paroxysmal atrial fibrillation: Secondary | ICD-10-CM | POA: Diagnosis not present

## 2017-01-30 DIAGNOSIS — I6523 Occlusion and stenosis of bilateral carotid arteries: Secondary | ICD-10-CM | POA: Diagnosis not present

## 2017-02-01 DIAGNOSIS — I480 Paroxysmal atrial fibrillation: Secondary | ICD-10-CM | POA: Insufficient documentation

## 2017-02-10 DIAGNOSIS — I480 Paroxysmal atrial fibrillation: Secondary | ICD-10-CM | POA: Diagnosis not present

## 2017-02-12 DIAGNOSIS — N401 Enlarged prostate with lower urinary tract symptoms: Secondary | ICD-10-CM | POA: Diagnosis not present

## 2017-02-12 DIAGNOSIS — E1140 Type 2 diabetes mellitus with diabetic neuropathy, unspecified: Secondary | ICD-10-CM | POA: Diagnosis not present

## 2017-02-12 DIAGNOSIS — I10 Essential (primary) hypertension: Secondary | ICD-10-CM | POA: Diagnosis not present

## 2017-02-12 DIAGNOSIS — Z79899 Other long term (current) drug therapy: Secondary | ICD-10-CM | POA: Diagnosis not present

## 2017-02-12 DIAGNOSIS — I480 Paroxysmal atrial fibrillation: Secondary | ICD-10-CM | POA: Diagnosis not present

## 2017-02-12 DIAGNOSIS — K219 Gastro-esophageal reflux disease without esophagitis: Secondary | ICD-10-CM | POA: Diagnosis not present

## 2017-02-12 DIAGNOSIS — E782 Mixed hyperlipidemia: Secondary | ICD-10-CM | POA: Diagnosis not present

## 2017-02-12 DIAGNOSIS — Z23 Encounter for immunization: Secondary | ICD-10-CM | POA: Diagnosis not present

## 2017-02-12 DIAGNOSIS — R351 Nocturia: Secondary | ICD-10-CM | POA: Diagnosis not present

## 2017-02-12 DIAGNOSIS — M170 Bilateral primary osteoarthritis of knee: Secondary | ICD-10-CM | POA: Diagnosis not present

## 2017-02-12 DIAGNOSIS — E538 Deficiency of other specified B group vitamins: Secondary | ICD-10-CM | POA: Diagnosis not present

## 2017-02-18 DIAGNOSIS — Z7901 Long term (current) use of anticoagulants: Secondary | ICD-10-CM | POA: Diagnosis not present

## 2017-02-18 DIAGNOSIS — Z5181 Encounter for therapeutic drug level monitoring: Secondary | ICD-10-CM | POA: Diagnosis not present

## 2017-02-26 DIAGNOSIS — I6523 Occlusion and stenosis of bilateral carotid arteries: Secondary | ICD-10-CM | POA: Diagnosis not present

## 2017-02-26 DIAGNOSIS — I10 Essential (primary) hypertension: Secondary | ICD-10-CM | POA: Diagnosis not present

## 2017-02-26 DIAGNOSIS — Z5181 Encounter for therapeutic drug level monitoring: Secondary | ICD-10-CM | POA: Diagnosis not present

## 2017-02-26 DIAGNOSIS — E782 Mixed hyperlipidemia: Secondary | ICD-10-CM | POA: Diagnosis not present

## 2017-02-26 DIAGNOSIS — I480 Paroxysmal atrial fibrillation: Secondary | ICD-10-CM | POA: Diagnosis not present

## 2017-02-26 DIAGNOSIS — Z7901 Long term (current) use of anticoagulants: Secondary | ICD-10-CM | POA: Diagnosis not present

## 2017-03-25 DIAGNOSIS — I4891 Unspecified atrial fibrillation: Secondary | ICD-10-CM | POA: Diagnosis not present

## 2017-05-01 DIAGNOSIS — N138 Other obstructive and reflux uropathy: Secondary | ICD-10-CM | POA: Diagnosis not present

## 2017-05-01 DIAGNOSIS — N401 Enlarged prostate with lower urinary tract symptoms: Secondary | ICD-10-CM | POA: Diagnosis not present

## 2017-05-01 DIAGNOSIS — R339 Retention of urine, unspecified: Secondary | ICD-10-CM | POA: Diagnosis not present

## 2017-05-01 DIAGNOSIS — N3941 Urge incontinence: Secondary | ICD-10-CM | POA: Diagnosis not present

## 2017-08-16 ENCOUNTER — Ambulatory Visit (INDEPENDENT_AMBULATORY_CARE_PROVIDER_SITE_OTHER): Payer: Medicare Other

## 2017-08-16 ENCOUNTER — Ambulatory Visit
Admission: EM | Admit: 2017-08-16 | Discharge: 2017-08-16 | Disposition: A | Discharge status: Home / Self Care | Payer: Medicare Other | Attending: Family Medicine | Admitting: Family Medicine

## 2017-08-16 ENCOUNTER — Other Ambulatory Visit: Payer: Self-pay

## 2017-08-16 DIAGNOSIS — S8992XA Unspecified injury of left lower leg, initial encounter: Secondary | ICD-10-CM

## 2017-08-16 DIAGNOSIS — M79662 Pain in left lower leg: Secondary | ICD-10-CM | POA: Diagnosis not present

## 2017-08-16 DIAGNOSIS — W2209XA Striking against other stationary object, initial encounter: Secondary | ICD-10-CM | POA: Diagnosis not present

## 2017-08-16 DIAGNOSIS — T148XXA Other injury of unspecified body region, initial encounter: Secondary | ICD-10-CM | POA: Diagnosis not present

## 2017-08-16 NOTE — ED Provider Notes (Signed)
[facility]    CSN: [id number] Arrival date & time: 08/16/17  1120  History   Chief Complaint Chief Complaint  Patient presents with  . Leg Injury   HPI  84 year old male who is currently on warfarin presents with a left leg injury.  Patient states that he was exercising on Wednesday.  He states that he inadvertently hit his leg on the machine as he was getting off.  He developed swelling of the anterior tibia of his left leg.  He subsequently has developed some mild swelling and bruising around his ankle and foot.  Patient reports pain is mild in severity.  He is able to do his normal activities.  No difficulty ambulating.  He is been elevating and icing without resolution.  No known exacerbating factors.  No other associated symptoms.  No other complaints.  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 Ubaldo Glassing prescribing warfarin   Past Surgical History:  Procedure Laterality Date  . CATARACT EXTRACTION W/PHACO Right 09/10/2015   Procedure: CATARACT EXTRACTION PHACO AND INTRAOCULAR LENS PLACEMENT (IOC);  Surgeon: Ronnell Freshwater, MD;  Location: [facility];  Service: Ophthalmology;  Laterality: Right;  LEAVE MARTINS TOGETHER  . REPLACEMENT TOTAL KNEE     Home Medications    Prior to Admission medications   Medication Sig Start Date End Date Taking? Authorizing Provider    aspirin 81 MG tablet Take 81 mg by mouth daily.   Yes [provider]  cyanocobalamin 1000 MCG tablet Take 1,000 mcg by mouth daily.   Yes [provider]  finasteride (PROSCAR) 5 MG tablet Take 5 mg by mouth daily.   Yes [provider]  ramipril (ALTACE) 10 MG capsule Take 10 mg by mouth daily.   Yes [provider]  simvastatin (ZOCOR) 20 MG tablet Take 20 mg by mouth daily.   Yes [provider]  tamsulosin (FLOMAX) 0.4 MG CAPS capsule Take 0.4 mg by mouth daily. 07/21/17  Yes [provider]  warfarin (COUMADIN) 4 MG tablet Take 2 tablets by mouth daily to equal 8 mg 05/19/17  Yes [provider]  amLODipine (NORVASC) 10 MG tablet Take 10 mg by mouth daily.    [provider]  felodipine (PLENDIL) 5 MG 24 hr tablet Take 5 mg by mouth daily.    [provider]  finasteride (PROSCAR) 5 MG tablet Take 5 mg by mouth daily.    [provider]  Glucosamine-Chondroit-Vit C-Mn (GLUCOSAMINE CHONDR 1500 COMPLX PO) Take by mouth 2 (two) times daily.    [provider]  metoprolol tartrate (LOPRESSOR) 25 MG tablet Take 12.5 mg by mouth 2 (two) times daily. Reported on 09/10/2015    [provider]  Multiple Vitamins-Minerals (ICAPS AREDS 2 PO) Take by mouth 2 (two) times daily.    [provider]  ranitidine (ZANTAC) 150 MG tablet Take 150 mg by mouth 2 (two) times daily.    [provider]  senna-docusate (SENOKOT-S) 8.6-50 MG tablet Take 1 tablet by mouth 2 (two) times  daily.    [provider]    Family History Colon cancer Brother    Stroke Father    Dementia Mother    Diabetes Mother     Relation Name Status Comments  Brother     Father  Deceased (Age 29) CVA  Mother  Deceased (Age in her 26's) dementia   Social History Social History   Tobacco Use  . Smoking status: Never Smoker  . Smokeless tobacco: Never Used  Substance Use Topics  .  Alcohol use: No  . Drug use: Not on file   Allergies   Patient has no known allergies.  Review of Systems Review of Systems  Constitutional: Negative.   Musculoskeletal:       Left leg swelling, bruising.   Physical Exam Triage Vital Signs ED Triage Vitals  Enc Vitals Group     BP 08/16/17 1137 (!) 164/69     Pulse Rate 08/16/17 1137 64     Resp --      Temp 08/16/17 1137 (!) 97.5 F (36.4 C)     Temp Source 08/16/17 1137 Oral     SpO2 08/16/17 1137 100 %     Weight 08/16/17 1131 140 lb (63.5 kg)     Height 08/16/17 1131 5\' 6"  (1.676 m)     Head Circumference --      Peak Flow --      Pain Score 08/16/17 1130 2     Pain Loc --      Pain Edu? --      Excl. in [place]? --    Updated Vital Signs BP (!) 164/69 (BP Location: Left Arm)   Pulse 64   Temp (!) 97.5 F (36.4 C) (Oral)   Ht 5\' 6"  (1.676 m)   Wt 140 lb (63.5 kg)   SpO2 100%   BMI 22.60 kg/m   Physical Exam  Constitutional: He is oriented to person, place, and time. He appears well-developed. No distress.  HENT:  Head: Normocephalic and atraumatic.  Cardiovascular: Normal rate and regular rhythm.  Pulmonary/Chest: Effort normal and breath sounds normal. He has no wheezes. He has no rales.  Musculoskeletal:  Left leg -lateral lower leg with a large hematoma. Patient with bruising and mild swelling around the foot and ankle.  No areas of tenderness to palpation.  Neurological: He is alert and oriented to person, place, and time.  Psychiatric: He has a normal mood and affect. His behavior is normal.  Nursing note and vitals reviewed.  UC Treatments / Results  Labs (all labs ordered are listed, but only abnormal results are displayed) Labs Reviewed - No data to display  EKG None Radiology Dg Tibia/fibula Left  Result Date: 08/16/2017 CLINICAL DATA:  No known trauma.  Swelling and bruising. EXAM: LEFT TIBIA AND FIBULA - 2 VIEW COMPARISON:  None. FINDINGS: Soft tissue swelling anteriorly and laterally  consistent with symptoms. Vascular calcifications. No bony erosion or fracture. No bony lesion. IMPRESSION: Soft tissue swelling as above.  No underlying bony abnormality. Electronically Signed   By: Dorise Bullion III M.D   On: 08/16/2017 12:09   Procedures Procedures (including critical care time)  Medications Ordered in UC Medications - No data to display   Initial Impression / Assessment and Plan / UC Course  I have reviewed the triage vital signs and the nursing notes.  Pertinent labs & imaging results that were available during my care of the patient were reviewed by me and considered in my medical  decision making (see chart for details).    84 year old male presents with an injury.  Has bruising and a hematoma. Xray negative. Rest, ice, elevation. If worsens or continues to be bothersome, can consider drainage.  I gave information regarding 1 of my colleagues who does ultrasound-guided procedures Hulan Saas).  Final Clinical Impressions(s) / UC Diagnoses   Final diagnoses:  Injury of left lower extremity, initial encounter  Hematoma   ED Discharge Orders    None     Controlled Substance Prescriptions [place] Controlled Substance Registry consulted? Not Applicable   Coral Spikes, DO 08/16/17 1222

## 2017-08-16 NOTE — ED Triage Notes (Signed)
Patient states he was exercising and hit his left leg on an exercising machine 08/12/2017. Swelling and bruising left ankle, foot and leg.He states it is tight and feels warm to touch.

## 2017-08-16 NOTE — Discharge Instructions (Signed)
Rest, elevation.  Take care  Dr. Lacinda Axon

## 2017-09-15 ENCOUNTER — Ambulatory Visit (INDEPENDENT_AMBULATORY_CARE_PROVIDER_SITE_OTHER): Payer: Medicare Other | Admitting: Surgery

## 2017-09-15 ENCOUNTER — Encounter: Payer: Self-pay | Admitting: Surgery

## 2017-09-15 VITALS — BP 167/70 | HR 53 | Temp 95.4°F | Ht 66.0 in | Wt 144.0 lb

## 2017-09-15 DIAGNOSIS — S8012XD Contusion of left lower leg, subsequent encounter: Secondary | ICD-10-CM

## 2017-09-15 NOTE — Progress Notes (Signed)
Surgical Clinic History and Physical  Referring provider:  Ezequiel Kayser, MD [place], [place] [address]  HISTORY OF PRESENT ILLNESS (HPI):  84 y.o. male presents for evaluation of his Left leg/ankle hematoma. Patient reports he initially thought he hit his leg on equipment at the gym where he exercises 3x per week, as reported 5 weeks ago to [facility], but patient states he has since realized (verified by his son, also present) that he hit his Left leg on a metal component of a golf cart. Regardless, patient and his son describe that the initial redness/bruising surrounding the hematoma has resolved, but it doesn't appear to be significantly smaller than when patient's injury occurred ~5 weeks ago. It is for the latter reason that patient presents today for further evaluation. Patient denies much Left leg pain and denies any persistent redness, drainage, or fever/chills. He also adds that his primary care physician has held the Coumadin he only 3 months began taking for atrial fibrillation. Patient otherwise denies lower extremity edema, CP, or SOB.  PAST MEDICAL HISTORY (PMH):  Past Medical History:  Diagnosis Date  . BPH (benign prostatic hypertrophy)   . GERD (gastroesophageal reflux disease)   . Hypercholesteremia   . Hypertension      PAST SURGICAL HISTORY ([place]):  Past Surgical History:  Procedure Laterality Date  . CATARACT EXTRACTION W/PHACO Right 09/10/2015   Procedure: CATARACT EXTRACTION PHACO AND INTRAOCULAR LENS PLACEMENT (IOC);  Surgeon: Ronnell Freshwater, MD;  Location: [facility];  Service: Ophthalmology;  Laterality: Right;  LEAVE MARTINS TOGETHER  . REPLACEMENT TOTAL KNEE       MEDICATIONS:  Prior to Admission medications   Medication Sig Start Date End Date Taking? Authorizing Provider  acetaminophen (TYLENOL) 325 MG tablet Take by mouth.   Yes [provider]  amLODipine (NORVASC) 10 MG tablet  Take 10 mg by mouth daily.   Yes [provider]  aspirin 81 MG tablet Take 81 mg by mouth daily.   Yes [provider]  Aspirin-Calcium Carbonate (BAYER WOMENS) 541 [id number] MG TABS Take by mouth.   Yes [provider]  Blood Glucose Calibration (ACCU-CHEK GUIDE CONTROL) LIQD  06/30/17  Yes [provider]  cyanocobalamin 1000 MCG tablet Take 1,000 mcg by mouth daily.   Yes [provider]  felodipine (PLENDIL) 5 MG 24 hr tablet Take 5 mg by mouth daily.   Yes [provider]  finasteride (PROSCAR) 5 MG tablet Take 5 mg by mouth daily.   Yes [provider]  finasteride (PROSCAR) 5 MG tablet Take 5 mg by mouth daily.   Yes [provider]  metoprolol tartrate (LOPRESSOR) 25 MG tablet Take 12.5 mg by mouth 2 (two) times daily. Reported on 09/10/2015   Yes [provider]  Multiple Vitamins-Minerals (ICAPS AREDS 2 PO) Take by mouth 2 (two) times daily.   Yes [provider]  senna-docusate (SENOKOT-S) 8.6-50 MG tablet Take 1 tablet by mouth 2 (two) times daily.   Yes [provider]  simvastatin (ZOCOR) 20 MG tablet Take 20 mg by mouth daily.   Yes [provider]  tamsulosin (FLOMAX) 0.4 MG CAPS capsule Take 0.4 mg by mouth daily. 07/21/17  Yes [provider]  warfarin (COUMADIN) 4 MG tablet Take 2 tablets by mouth daily to equal 8 mg 05/19/17  Yes [provider]     ALLERGIES:  No Known Allergies   SOCIAL HISTORY:  Social History  Socioeconomic History  . Marital status: Married    Spouse name: Not on file  . Number of children: Not on file  . Years of education: Not on file  . Highest education level: Not on file  Occupational History  . Not on file  Social Needs  . Financial resource strain: Not on file  . Food insecurity:    Worry: Not on file    Inability: Not on file  . Transportation needs:    Medical: Not on file    Non-medical: Not on file  Tobacco Use   . Smoking status: Never Smoker  . Smokeless tobacco: Never Used  Substance and Sexual Activity  . Alcohol use: No  . Drug use: Not on file  . Sexual activity: Not on file  Lifestyle  . Physical activity:    Days per week: Not on file    Minutes per session: Not on file  . Stress: Not on file  Relationships  . Social connections:    Talks on phone: Not on file    Gets together: Not on file    Attends religious service: Not on file    Active member of club or organization: Not on file    Attends meetings of clubs or organizations: Not on file    Relationship status: Not on file  . Intimate partner violence:    Fear of current or ex partner: Not on file    Emotionally abused: Not on file    Physically abused: Not on file    Forced sexual activity: Not on file  Other Topics Concern  . Not on file  Social History Narrative  . Not on file    The patient currently resides (home / rehab facility / nursing home): Home The patient normally is (ambulatory / bedbound): Ambulatory  FAMILY HISTORY:  History reviewed. No pertinent family history.  Otherwise negative/non-contributory.  REVIEW OF SYSTEMS:  Constitutional: denies any other weight loss, fever, chills, or sweats  Eyes: denies any other vision changes, history of eye injury  ENT: denies sore throat, hearing problems  Respiratory: denies shortness of breath, wheezing  Cardiovascular: denies chest pain, palpitations  Gastrointestinal: denies abdominal pain, N/V, or diarrhea Musculoskeletal: denies any other joint pains or cramps  Skin: Denies any other rashes or skin discolorations except as per HPI Neurological: denies any other headache, dizziness, weakness  Psychiatric: Denies any other depression, anxiety   All other review of systems were otherwise negative   VITAL SIGNS:  @VSRANGES @     Height: 5\' 6"  (167.6 cm) Weight: 144 lb (65.3 kg) BMI (Calculated): 23.25   PHYSICAL EXAM:  Constitutional:  -- Normal body  habitus  -- Awake, alert, and oriented x3  Eyes:  -- Pupils equally round and reactive to light  -- No scleral icterus  Ear, nose, throat:  -- No jugular venous distension -- No nasal drainage, bleeding Pulmonary:  -- No crackles  -- Equal breath sounds bilaterally -- Breathing non-labored at rest Cardiovascular:  -- S1, S2 present  -- No pericardial rubs  Gastrointestinal:  -- Abdomen soft, nontender, non-distended, no guarding/rebound  -- No abdominal masses appreciated, pulsatile or otherwise  Musculoskeletal and Integumentary:  -- Wounds or skin discoloration: Non-tender non-erythematous ~3 cm x 2 cm egg-shaped and small egg-sized firm Left lateral mid-/distal- leg hematoma without any overlying ecchymosis, necrosis, or ulceration -- Extremities: B/L UE and LE FROM, hands and feet warm, no edema  Neurologic:  -- Motor function: Intact and symmetric -- Sensation:  Intact and symmetric  Labs:  CBC Latest Ref Rng & Units 06/29/2013 06/29/2013 03/09/2013  WBC 3.8 - 10.6 x10 3/mm 3 - 8.3 -  Hemoglobin 13.0 - 18.0 g/dL 15.2 15.3 12.3(L)  Hematocrit 40.0 - 52.0 % - 46.0 -  Platelets 150 - 440 x10 3/mm 3 - 144(L) 118(L)   CMP Latest Ref Rng & Units 03/09/2013 03/08/2013 02/28/2013  Glucose 65 - 99 mg/dL 127(H) 117(H) 147(H)  BUN 7 - 18 mg/dL 11 13 18   Creatinine 0.60 - 1.30 mg/dL 0.87 0.91 0.99  Sodium 136 - 145 mmol/L 141 136 139  Potassium 3.5 - 5.1 mmol/L 4.4 4.3 4.7  Chloride 98 - 107 mmol/L 110(H) 106 108(H)  CO2 21 - 32 mmol/L 26 26 28   Calcium 8.5 - 10.1 mg/dL 8.2(L) 7.9(L) 8.9    Imaging studies:  LLE Tibia-Fibula X-ray (08/16/2017) Soft tissue swelling anteriorly and laterally consistent with symptoms. Vascular calcifications. No bony erosion or fracture. No bony lesion.   Assessment/Plan:  84 y.o. male with a stable non-enlarging slowly resolving essentially asymptomatic non-infected without evidence of associated overlying skin necrosis/ulceration, complicated by  co-morbidities including therapeutic anticoagulation (on hold) for atrial fibrillation, HTN, hypercholesterolemia, GERD, BPH, and osteoarthritis.   - as long as no evidence of infection or skin necrosis/ulceration (and for this patient essentially asymptomatic), there is no indication for surgical intervention  - additional follow-up offered, patient and his son express preference to follow-up prn  - risk of prolonged hematoma/bleeding must be weighed against risk of stroke   - allow non-infected hematoma to resolve without intervention  - anticoagulation as per patient's primary care physician  - instructed to call if any questions or concerns  All of the above recommendations were discussed with the patient and patient's son, and all of patient's and family's questions were answered to their expressed satisfaction.  Thank you for the opportunity to participate in this patient's care.  -- Marilynne Drivers Rosana Hoes, MD, [place]: [facility] - Partnering for exceptional care. Office: [phone]

## 2017-09-15 NOTE — Patient Instructions (Signed)
Please call our office if you have questions or concerns. Coumadin followed by primary care physician.

## 2018-02-24 ENCOUNTER — Other Ambulatory Visit: Payer: Self-pay | Admitting: Physician Assistant

## 2018-02-24 DIAGNOSIS — M542 Cervicalgia: Secondary | ICD-10-CM

## 2018-03-10 ENCOUNTER — Ambulatory Visit: Payer: Medicare Other

## 2018-09-12 IMAGING — CR DG TIBIA/FIBULA 2V*L*
2 series · 2 of 2 positions shown · non-contrast
Comparison: None.

CLINICAL DATA: No known trauma.  Swelling and bruising.

EXAM:
LEFT TIBIA AND FIBULA - 2 VIEW

[tibia ap]
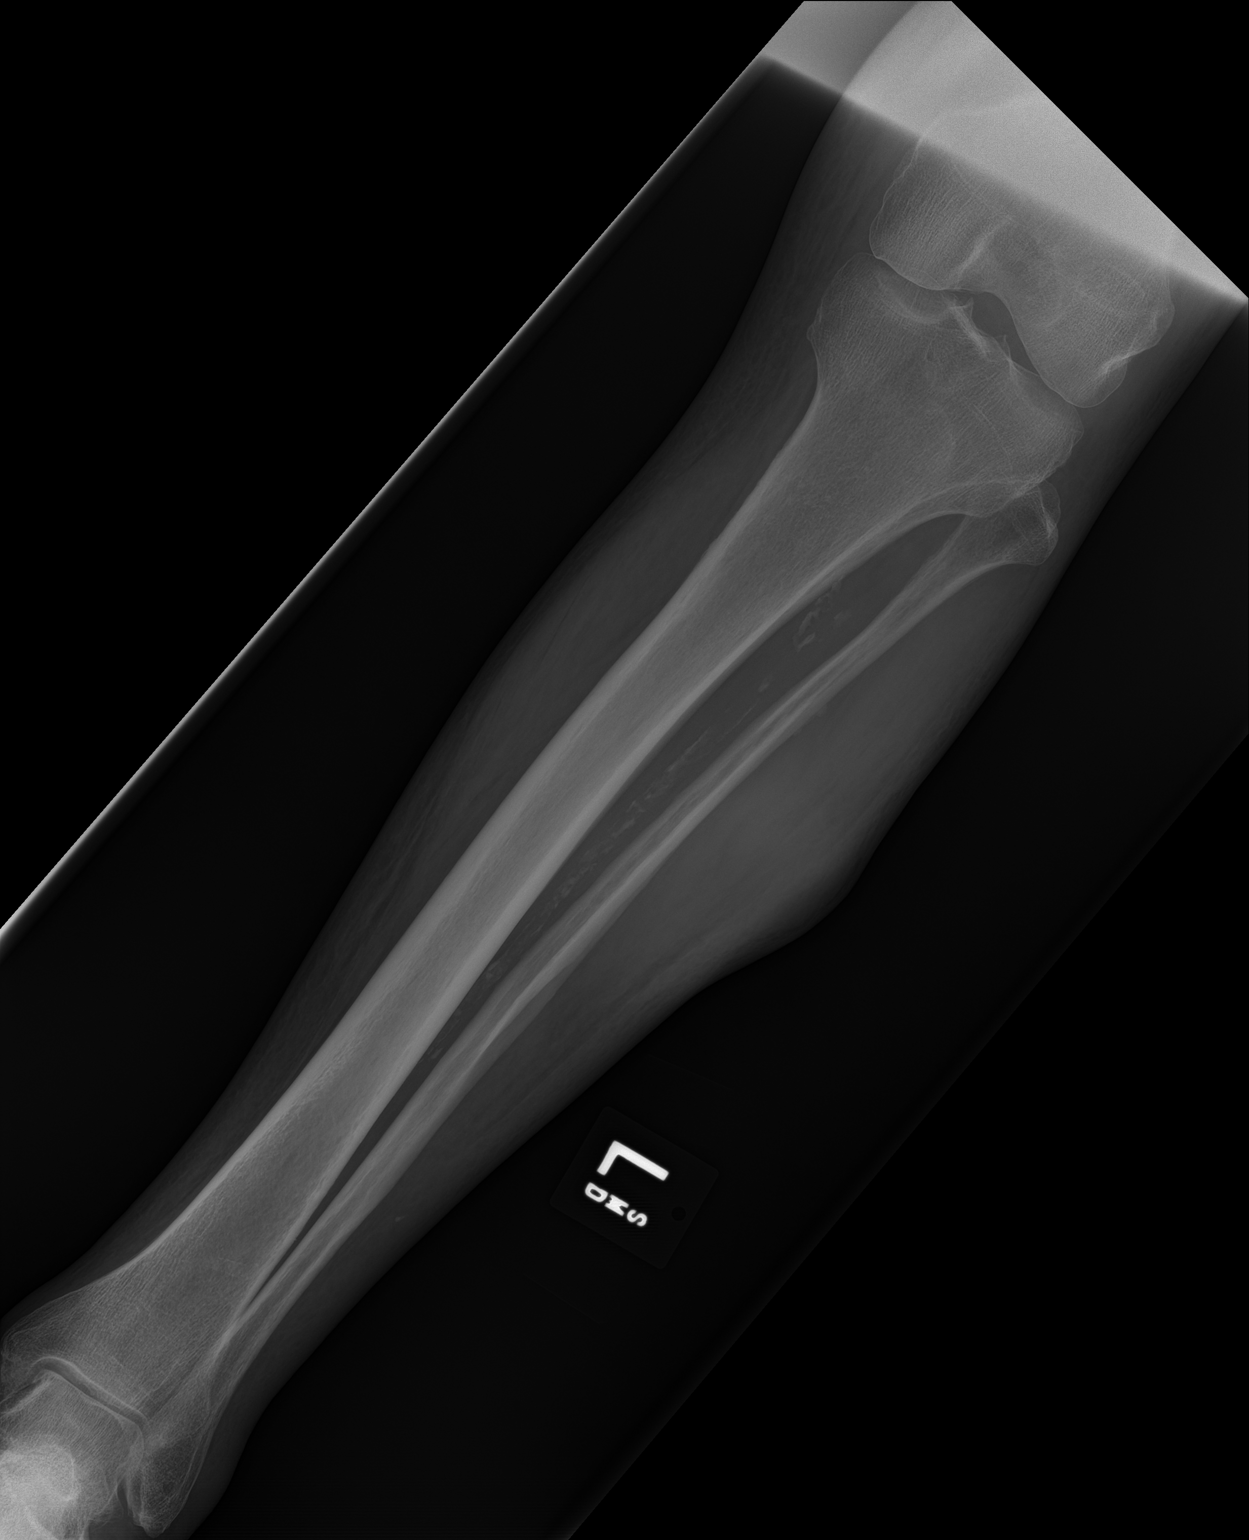

[tibia lat]
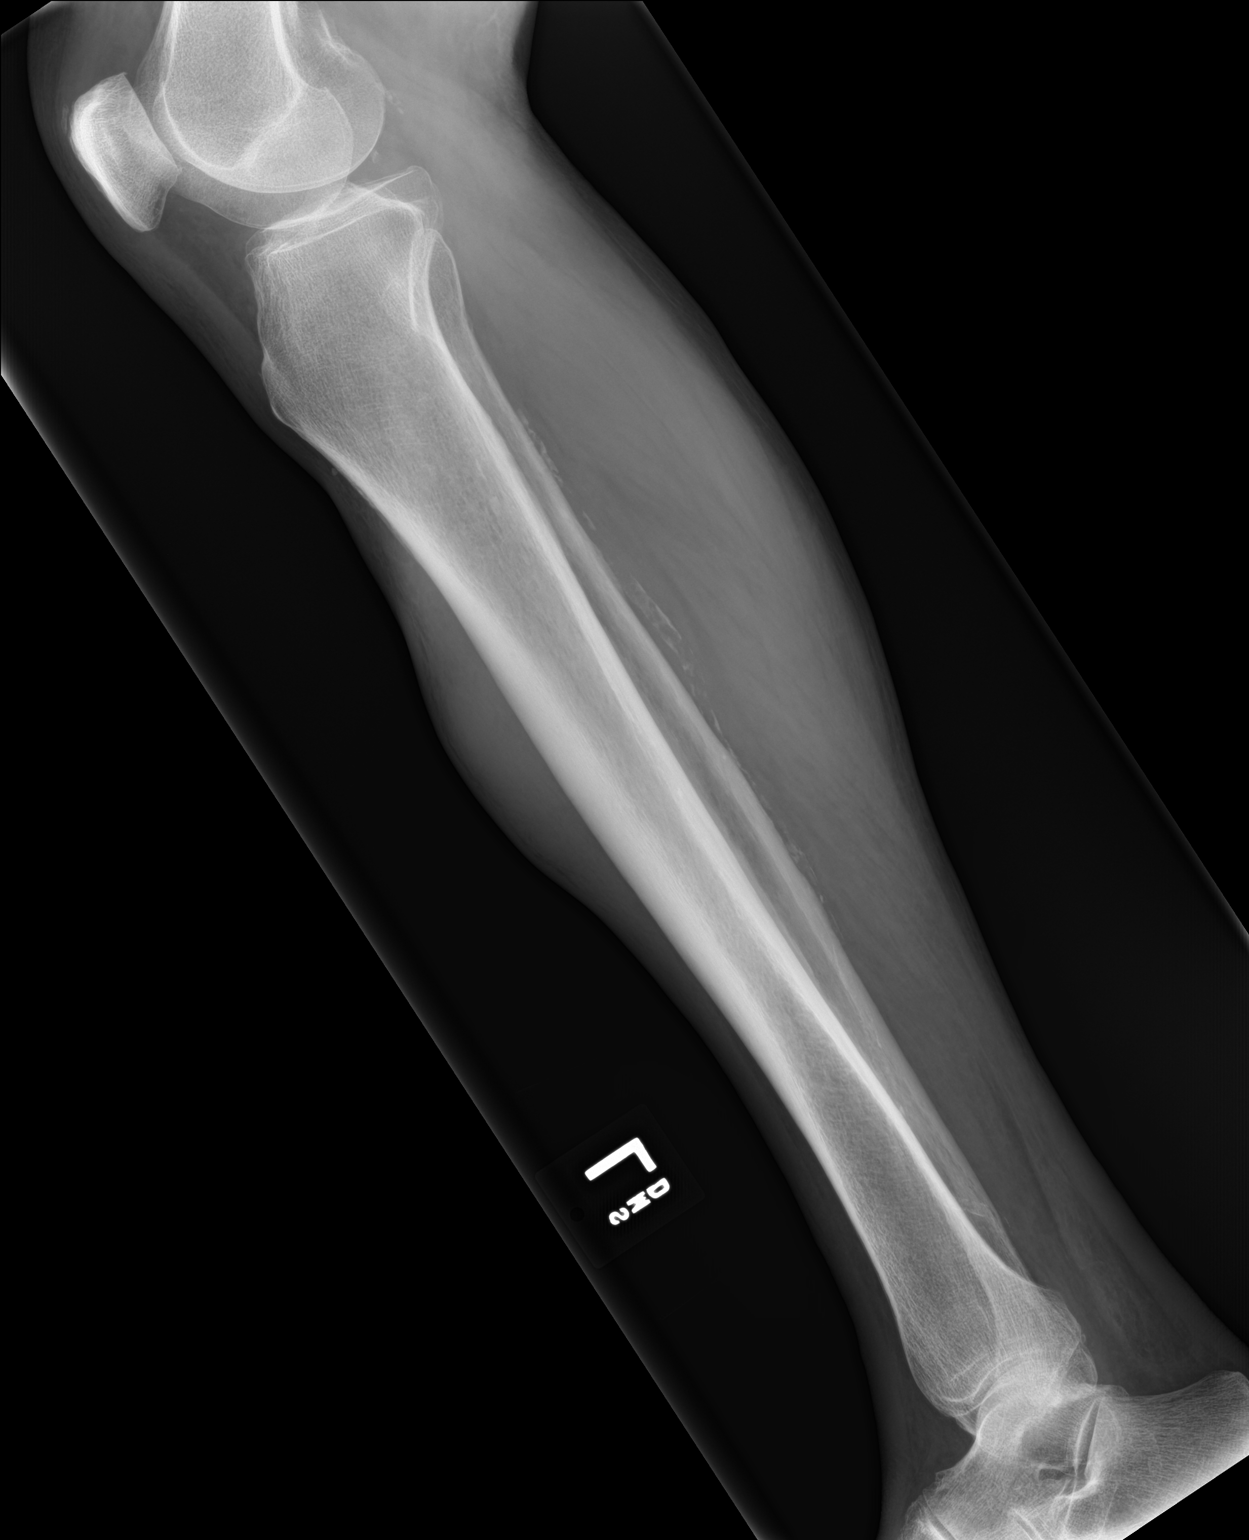

[2 of 2 positions shown; findings below may reference images not displayed]

FINDINGS: Soft tissue swelling anteriorly and laterally consistent with
symptoms. Vascular calcifications. No bony erosion or fracture. No
bony lesion.
IMPRESSION: Soft tissue swelling as above.  No underlying bony abnormality.

## 2018-12-11 ENCOUNTER — Ambulatory Visit: Payer: Medicare Other

## 2018-12-11 ENCOUNTER — Other Ambulatory Visit: Payer: Self-pay

## 2018-12-11 ENCOUNTER — Encounter: Payer: Self-pay | Admitting: Emergency Medicine

## 2018-12-11 ENCOUNTER — Ambulatory Visit
Admission: EM | Admit: 2018-12-11 | Discharge: 2018-12-11 | Disposition: A | Discharge status: Home / Self Care | Payer: Medicare Other

## 2018-12-11 DIAGNOSIS — M5416 Radiculopathy, lumbar region: Secondary | ICD-10-CM

## 2018-12-11 DIAGNOSIS — M47816 Spondylosis without myelopathy or radiculopathy, lumbar region: Secondary | ICD-10-CM

## 2018-12-11 MED ORDER — METHYLPREDNISOLONE 4 MG PO TBPK: ORAL_TABLET | ORAL | 0 refills | Status: DC

## 2018-12-11 NOTE — Discharge Instructions (Signed)
-  Take medication as directed. -Decrease activities over the next several days. -Alternate heat and ice to the left hip -Follow-up if symptoms fail to improve.

## 2018-12-11 NOTE — ED Provider Notes (Signed)
[facility]    CSN: [id number] Arrival date & time: 12/11/18  2025      History   Chief Complaint Chief Complaint  Patient presents with   Back Pain   Hip Pain    HPI Jerry Atkinson is a 85 y.o. male who presents today for evaluation of a 3-day history of left-sided low back and hip pain.  The patient works in his garden on a daily basis, he reports off-and-on discomfort however over the last 3 days he reports a continued aching and occasionally sharp pain in the left buttock with radiation down the left lower extremity.  The patient denies any surgical history to the lumbar spine or left hip, he is status post right total knee arthroplasty performed several years ago.  Patient denies any numbness or tingling or weakness to the left lower extremity.  He denies any falls or trauma affecting left side.  He denies any anterior groin pain.  He has not been taking medication for the discomfort at this time.   Back Pain Hip Pain    Past Medical History:  Diagnosis Date   BPH (benign prostatic hypertrophy)    GERD (gastroesophageal reflux disease)    Hypercholesteremia    Hypertension     Patient Active Problem List   Diagnosis Date Noted   Paroxysmal atrial fibrillation ([place]) 02/01/2017   Carotid atherosclerosis, bilateral 12/23/2016   Benign localized hyperplasia of prostate with urinary obstruction 11/10/2016   History of nephrolithiasis 11/10/2016   Incomplete emptying of bladder 11/10/2016   Urge incontinence 11/10/2016   Primary osteoarthritis of both knees 02/04/2016   B12 deficiency 09/06/2015   Diabetic polyneuropathy associated with type 2 diabetes mellitus ([place]) 09/06/2015   H/O myasthenia gravis 09/06/2015   Right leg weakness 09/06/2015   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   High risk medication use 07/30/2014   Status post total right knee replacement 03/12/2014   Benign essential hypertension 02/02/2014   Benign prostatic hyperplasia  02/02/2014   GERD (gastroesophageal reflux disease) 02/02/2014   Hyperlipidemia associated with type 2 diabetes mellitus ([place]) 02/02/2014   Perennial allergic rhinitis 02/02/2014   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 Authorizing Provider  acetaminophen (TYLENOL) 325 MG tablet Take by mouth.   Yes [provider]  amLODipine (NORVASC) 10 MG tablet Take 10 mg by mouth daily.   Yes [provider]  aspirin 81 MG tablet Take 81 mg by mouth daily.   Yes [provider]  Aspirin-Calcium Carbonate (BAYER WOMENS) [phone] MG TABS Take by mouth.   Yes [provider]  cyanocobalamin 1000 MCG tablet Take 1,000 mcg by mouth daily.   Yes [provider]  felodipine (PLENDIL) 5 MG 24 hr tablet Take 5 mg by mouth daily.   Yes [provider]  finasteride (PROSCAR) 5 MG tablet Take 5 mg by mouth daily.   Yes [provider]  metoprolol tartrate (LOPRESSOR) 25 MG tablet Take 12.5 mg by mouth 2 (two) times daily. Reported on 09/10/2015   Yes [provider]  Multiple Vitamins-Minerals (ICAPS AREDS 2 PO) Take by mouth 2 (two) times daily.   Yes [provider]  ramipril (ALTACE) 10 MG capsule Take by mouth. 02/04/18  Yes [provider]  senna-docusate (SENOKOT-S) 8.6-50 MG tablet Take 1 tablet by mouth 2 (two) times daily.   Yes [provider]  simvastatin (ZOCOR) 20 MG tablet Take 20 mg by mouth daily.   Yes [provider]    tamsulosin (FLOMAX) 0.4 MG CAPS capsule Take 0.4 mg by mouth daily. 07/21/17  Yes [provider]  warfarin (COUMADIN) 4 MG tablet Take 2 tablets by mouth daily to equal 8 mg 05/19/17  Yes [provider]  Blood Glucose Calibration (ACCU-CHEK GUIDE CONTROL) LIQD  06/30/17   [provider]  ELIQUIS 5 MG TABS tablet  09/14/18   [provider]  finasteride (PROSCAR) 5 MG tablet Take 5 mg by mouth daily.    [provider]  methylPREDNISolone (MEDROL DOSEPAK) 4 MG TBPK tablet Take medication by mouth per package instructions. 12/11/18   Lattie Corns, PA-C    Family History History reviewed. No pertinent family history.  Social History Social History   Tobacco Use   Smoking status: Never Smoker   Smokeless tobacco: Never Used  Substance Use Topics   Alcohol use: No   Drug use: Not on file   Allergies   Patient has no known allergies.  Review of Systems Review of Systems  Constitutional: Negative.   HENT: Negative.   Eyes: Negative.   Respiratory: Negative.   Cardiovascular: Negative.   Gastrointestinal: Negative.   Endocrine: Negative.   Genitourinary: Negative.   Musculoskeletal: Positive for back pain.  Skin: Negative.   Allergic/Immunologic: Negative.   Neurological: Negative.   Hematological: Negative.   Psychiatric/Behavioral: Negative.    Physical Exam Triage Vital Signs ED Triage Vitals  Enc Vitals Group     BP 12/11/18 0917 (!) 172/130     Pulse Rate 12/11/18 0917 69     Resp 12/11/18 0917 18     Temp 12/11/18 0917 (!) 97.5 F (36.4 C)     Temp Source 12/11/18 0917 Oral     SpO2 12/11/18 0917 100 %     Weight 12/11/18 0920 138 lb (62.6 kg)     Height 12/11/18 0920 5\' 5"  (1.651 m)     Head Circumference --      Peak Flow --      Pain Score 12/11/18 0919 9     Pain Loc --      Pain Edu? --      Excl. in [place]? --    No data found.  Updated Vital Signs BP (!) 172/130 (BP Location: Right Arm)    Pulse 69     Temp (!) 97.5 F (36.4 C) (Oral)    Resp 18    Ht 5\' 5"  (1.651 m)    Wt 138 lb (62.6 kg)    SpO2 100%    BMI 22.96 kg/m   Lumbar Spine: Examination of the lumbar spine reveals no bony abnormality, no edema, and no ecchymosis.  There is no step off.  The patient has had minimal extension of the lumbar spine, flexion 30 degrees with mild discomfort.  Mildly tender to palpation of the vertebral bodies of the lumbar sacral junction.  No muscle spasms present.  Patient is tender to palpation over the left sciatic notch.  Pain is with palpation to the left sacroiliac joint.  Bilateral Lower Extremities:  Examination of the lower extremities reveals no bony abnormality, no edema, and no ecchymosis.  The patient denies any pain with internal or external rotation to the left hip.  No pain with passive left hip flexion or extension.  He is intact light touch to left lower extremity.  Neurologic: The patient has a negative straight leg raise.  The patient has normal muscle strength testing for the quadriceps, calves, ankle dorsiflexion, ankle plantarflexion, and extensor hallicus longus.  The patient has sensation that is intact to light touch.  The deep tendon reflexes are nor   UC Treatments / Results  Labs (all labs ordered are listed, but only abnormal results are displayed) Labs Reviewed - No data to display  EKG   Radiology Dg Lumbar Spine 2-3 Views  Result Date: 12/11/2018 CLINICAL DATA:  85 year old male with chronic low back pain radiating to the left hip and leg. EXAM: LUMBAR SPINE - 2-3 VIEW COMPARISON:  None. FINDINGS: Normal lumbar segmentation. Osteopenia. Mild straightening of lumbar lordosis. Relatively preserved disc spaces. Widespread endplate spurring. No definite lumbar or lower thoracic compression fracture. The SI joints and visible sacrum appear intact. Negative abdominal visceral contours. IMPRESSION: Osteopenia with no acute osseous abnormality identified. Preserved disc  spaces with endplate spurring. If occult compression fracture is suspected then noncontrast Lumbar MRI or Nuclear Medicine Whole-body Bone Scan would evaluate with the highest sensitivity. Electronically Signed   By: Genevie Ann M.D.   On: 12/11/2018 09:59    Procedures Procedures (including critical care time)  Medications Ordered in UC Medications - No data to display  Initial Impression / Assessment and Plan / UC Course  I have reviewed the triage vital signs and the nursing notes.  Pertinent labs & imaging results that were available during my care of the patient were reviewed by me and considered in my medical decision making (see chart for details).     1.  Treatment options were discussed today with the patient. 2.  Instructed the patient that I believe his back is the primary cause of his pain versus his left hip. 3.  The patient was given a Medrol Dosepak to take for his ongoing low back discomfort.  Instructed the patient to alternate heat and ice.  Decrease activities over the next 24-48 hours. 4.  Follow-up on a as needed basis at this time. Final Clinical Impressions(s) / UC Diagnoses   Final diagnoses:  Lumbar spondylosis  Lumbar radiculopathy     Discharge Instructions     -Take medication as directed. -Decrease activities over the next several days. -Alternate heat and ice to the left hip -Follow-up if symptoms fail to improve.   ED Prescriptions    Medication Sig Dispense Auth. Provider   methylPREDNISolone (MEDROL DOSEPAK) 4 MG TBPK tablet Take medication by mouth per package instructions. 21 tablet Lattie Corns, PA-C     Controlled Substance Prescriptions [place] Controlled Substance Registry consulted? Not Applicable   Lattie Corns, PA-C 12/11/18 1019

## 2018-12-11 NOTE — ED Triage Notes (Signed)
Patient c/o left side low back pain/hip pain that started 3 days ago. He states he works in his garden everyday and states he is unsure if he pulled a muscle.

## 2019-11-15 ENCOUNTER — Ambulatory Visit: Payer: Medicare Other | Admitting: Dermatology

## 2019-11-15 ENCOUNTER — Other Ambulatory Visit: Payer: Self-pay

## 2019-11-15 ENCOUNTER — Encounter: Payer: Self-pay | Admitting: Dermatology

## 2019-11-15 DIAGNOSIS — L219 Seborrheic dermatitis, unspecified: Secondary | ICD-10-CM | POA: Diagnosis not present

## 2019-11-15 DIAGNOSIS — D1801 Hemangioma of skin and subcutaneous tissue: Secondary | ICD-10-CM | POA: Diagnosis not present

## 2019-11-15 DIAGNOSIS — L570 Actinic keratosis: Secondary | ICD-10-CM | POA: Diagnosis not present

## 2019-11-15 DIAGNOSIS — D692 Other nonthrombocytopenic purpura: Secondary | ICD-10-CM

## 2019-11-15 DIAGNOSIS — L578 Other skin changes due to chronic exposure to nonionizing radiation: Secondary | ICD-10-CM | POA: Diagnosis not present

## 2019-11-15 NOTE — Progress Notes (Signed)
New Patient Visit  Subjective  Jerry Atkinson is a 86 y.o. male who presents for the following: spots (face. Using CeraVe Cream and CeraVE SA Cream. Tried brothers hydrocortisone 2.5% cream.). Patient has seen Dr Phillip Heal in the past. No history of skin cancer, but he has had precancers treated.  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   The following portions of the chart were reviewed this encounter and updated as appropriate:      Review of Systems:  No other skin or systemic complaints except as noted in HPI or Assessment and Plan.  Objective  Well appearing patient in no apparent distress; mood and affect are within normal limits.  A focused examination was performed including face. Relevant physical exam findings are noted in the Assessment and Plan.  Objective  R malar cheek & lat x 7, R ear helix x 3, R nasal tip x 2, R forehead x 2, R postauricular neck x 2, L eyebrow x 1, L temple x 5, L malar cheek x 2, L cheek x 4, L neck below jaw x 2, L ear helix x 3, L antihelix x 1, L wrist x 1, L forearm x 1 (36): Erythematous thin papules/macules with gritty scale.   Objective  Right Parietal Scalp: Scaling with excoriations.   Assessment & Plan   Actinic Damage - diffuse scaly erythematous macules with underlying dyspigmentation - Recommend daily broad spectrum sunscreen SPF 30+ to sun-exposed areas, reapply every 2 hours as needed.  - Call for new or changing lesions.  Hemangiomas - Red papules - L temporal scalp, L malar cheek - Discussed benign nature - Observe - Call for any changes  Purpura - Violaceous macules and patches - Benign - Related to age, sun damage and/or use of blood thinners - Observe - Can use OTC arnica containing moisturizer such as Dermend Bruise Formula if desired - Call for worsening or other concerns    AK (actinic keratosis) (36) R malar cheek & lat x 7, R ear helix x 3, R nasal tip x 2, R forehead  x 2, R postauricular neck x 2, L eyebrow x 1, L temple x 5, L malar cheek x 2, L cheek x 4, L neck below jaw x 2, L ear helix x 3, L antihelix x 1, L wrist x 1, L forearm x 1  Discussed PDT treatment to face and info given. May consider this fall/winter.  Also discussed "chemo" cream, but pt is not interested.  Destruction of lesion - R malar cheek & lat x 7, R ear helix x 3, R nasal tip x 2, R forehead x 2, R postauricular neck x 2, L eyebrow x 1, L temple x 5, L malar cheek x 2, L cheek x 4, L neck below jaw x 2, L ear helix x 3, L antihelix x 1, L wrist x 1, L forearm x 1  Destruction method: cryotherapy   Informed consent: discussed and consent obtained   Lesion destroyed using liquid nitrogen: Yes   Region frozen until ice ball extended beyond lesion: Yes   Outcome: patient tolerated procedure well with no complications   Post-procedure details: wound care instructions given    Seborrheic dermatitis Right Parietal Scalp  Start Head and Shoulders shampoo - Apply to scalp and let sit several minutes before rinsing.   Return in about 4 months (around 03/17/2020) for AKs, PDT face.  I, Jamesetta Orleans,  CMA, am acting as scribe for Brendolyn Patty, MD .  Documentation: I have reviewed the above documentation for accuracy and completeness, and I agree with the above.  Brendolyn Patty MD

## 2019-11-15 NOTE — Patient Instructions (Addendum)
Head and Shoulders shampoo - Apply to scalp and let sit several minutes before rinsing.   Cryotherapy Aftercare  . Wash gently with soap and water everyday.   Marland Kitchen Apply Vaseline and Band-Aid daily until healed.

## 2020-01-07 IMAGING — CR LUMBAR SPINE - 2-3 VIEW
4 series · 4 of 4 positions shown · non-contrast
Comparison: None.

CLINICAL DATA: 87-year-old female with chronic low back pain
radiating to the left hip and leg.

EXAM:
LUMBAR SPINE - 2-3 VIEW

[l-spine ap]
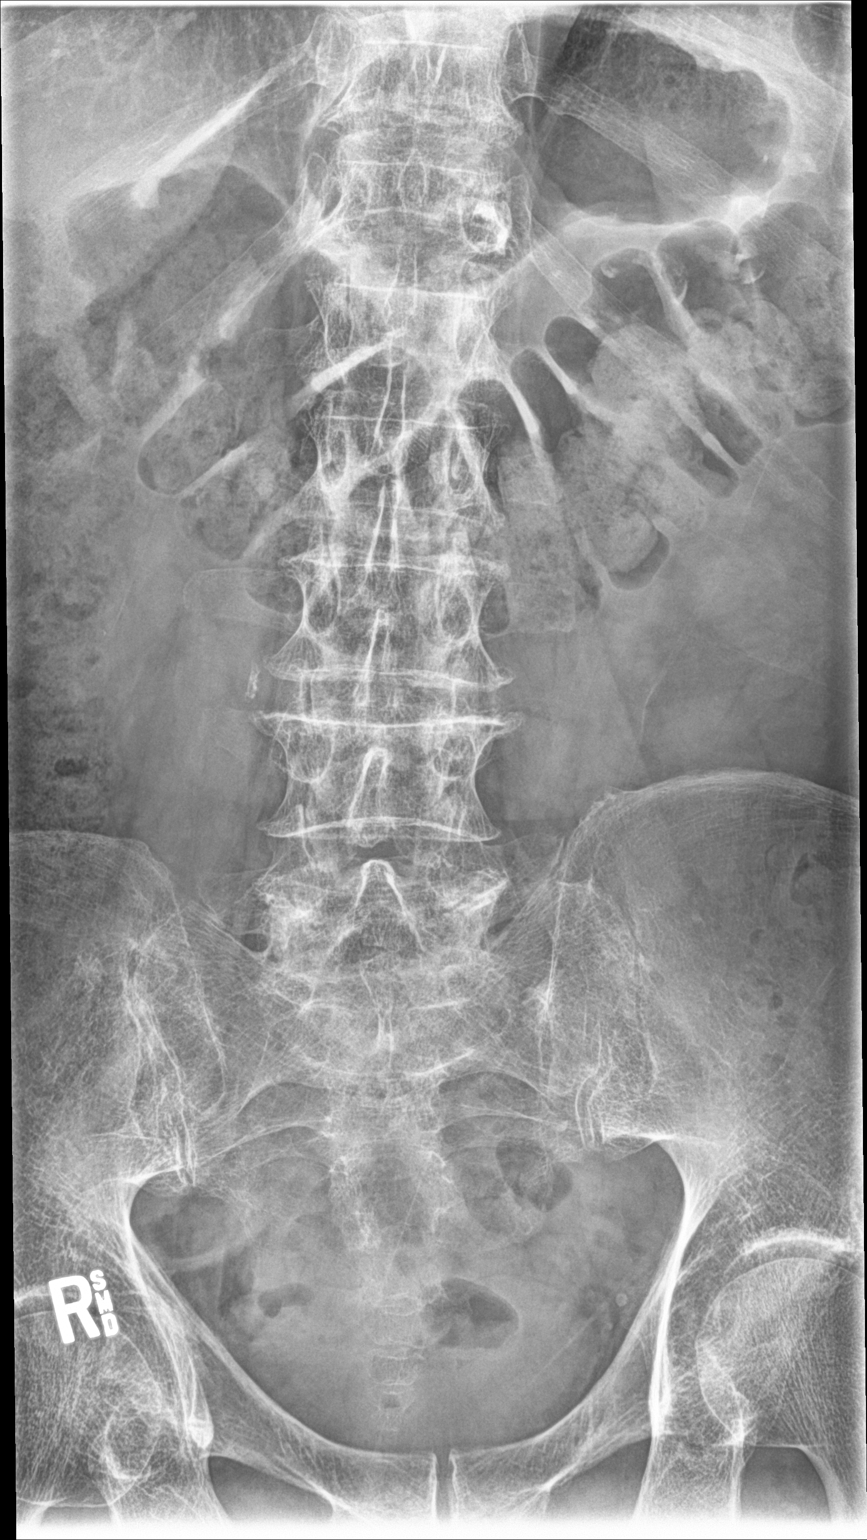

[l-spine lat (1 of 2)]
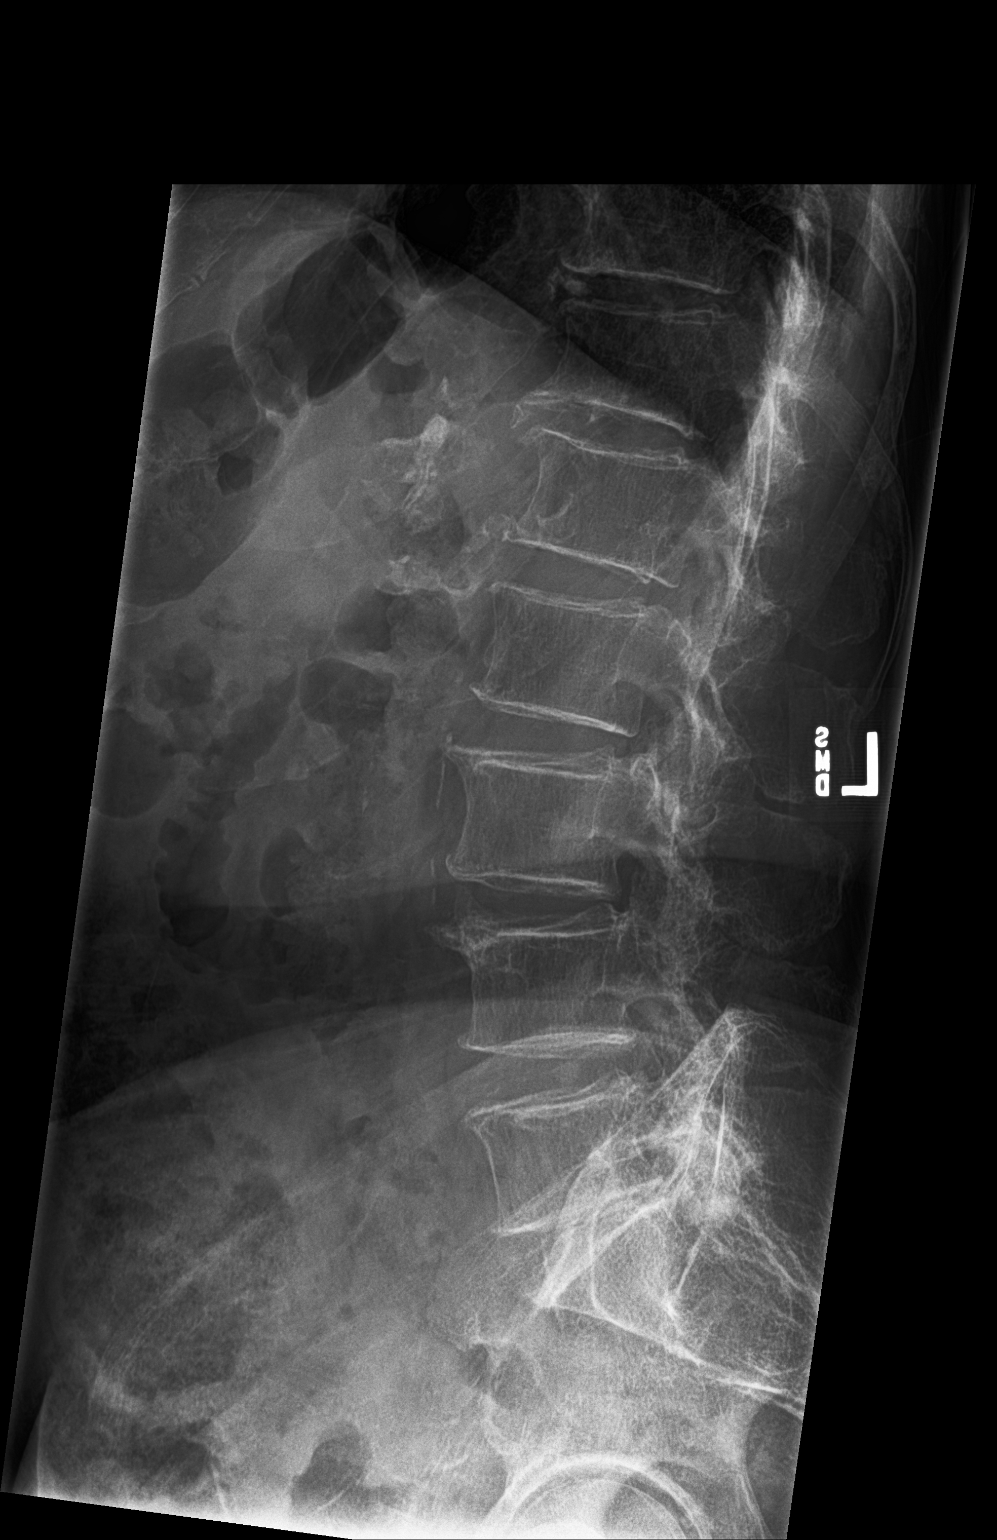

[l-spine spot]
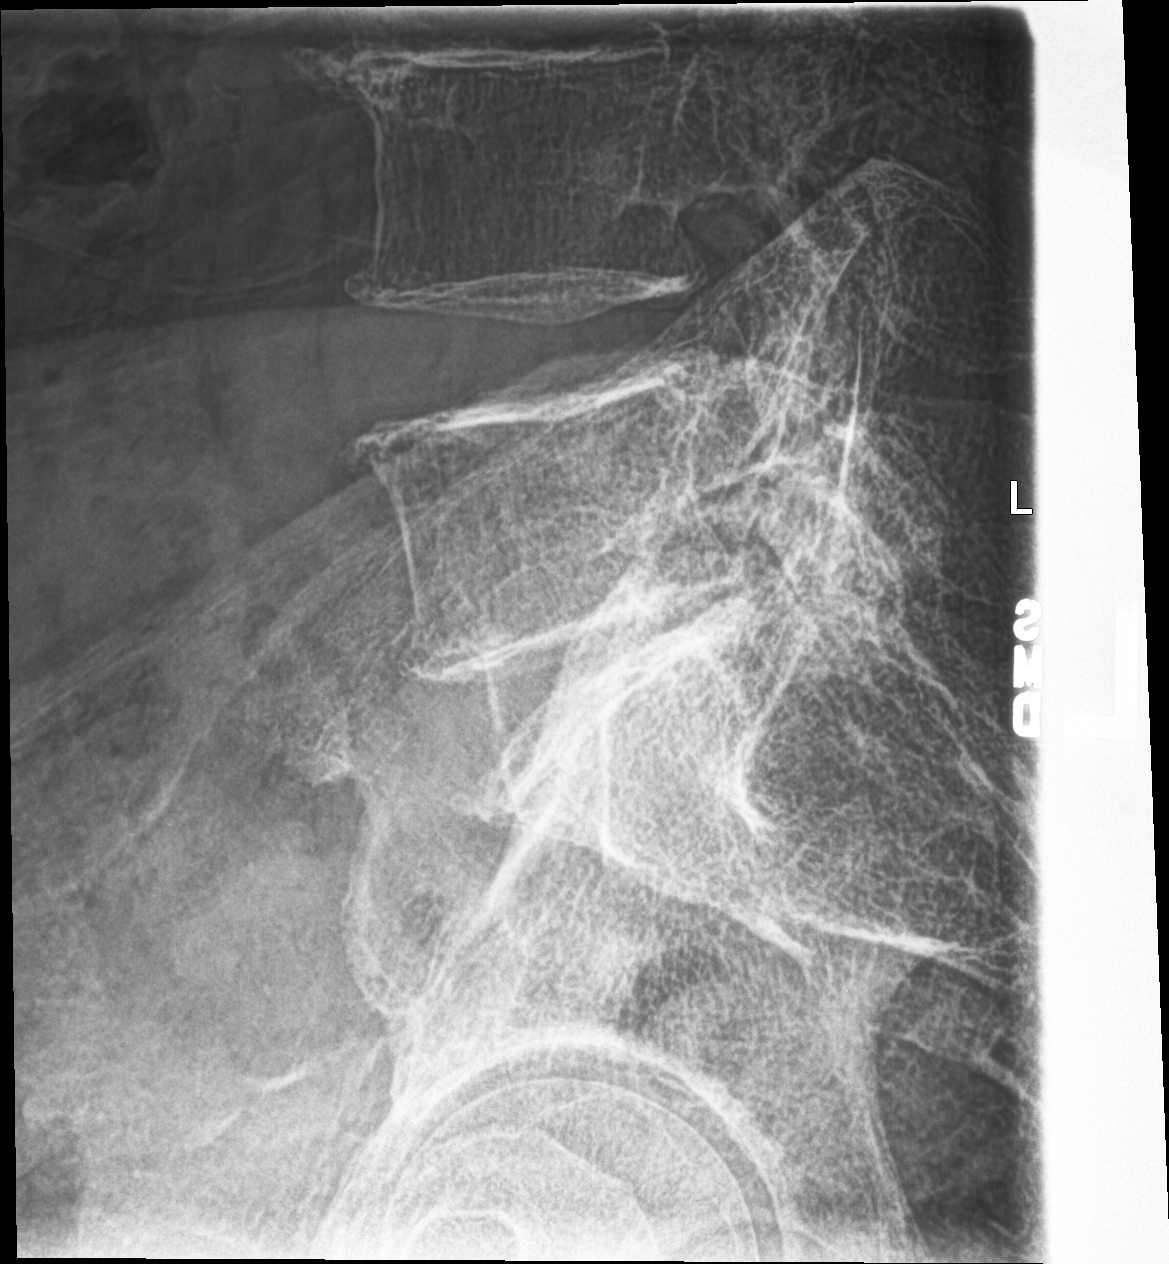

[l-spine lat (2 of 2)]
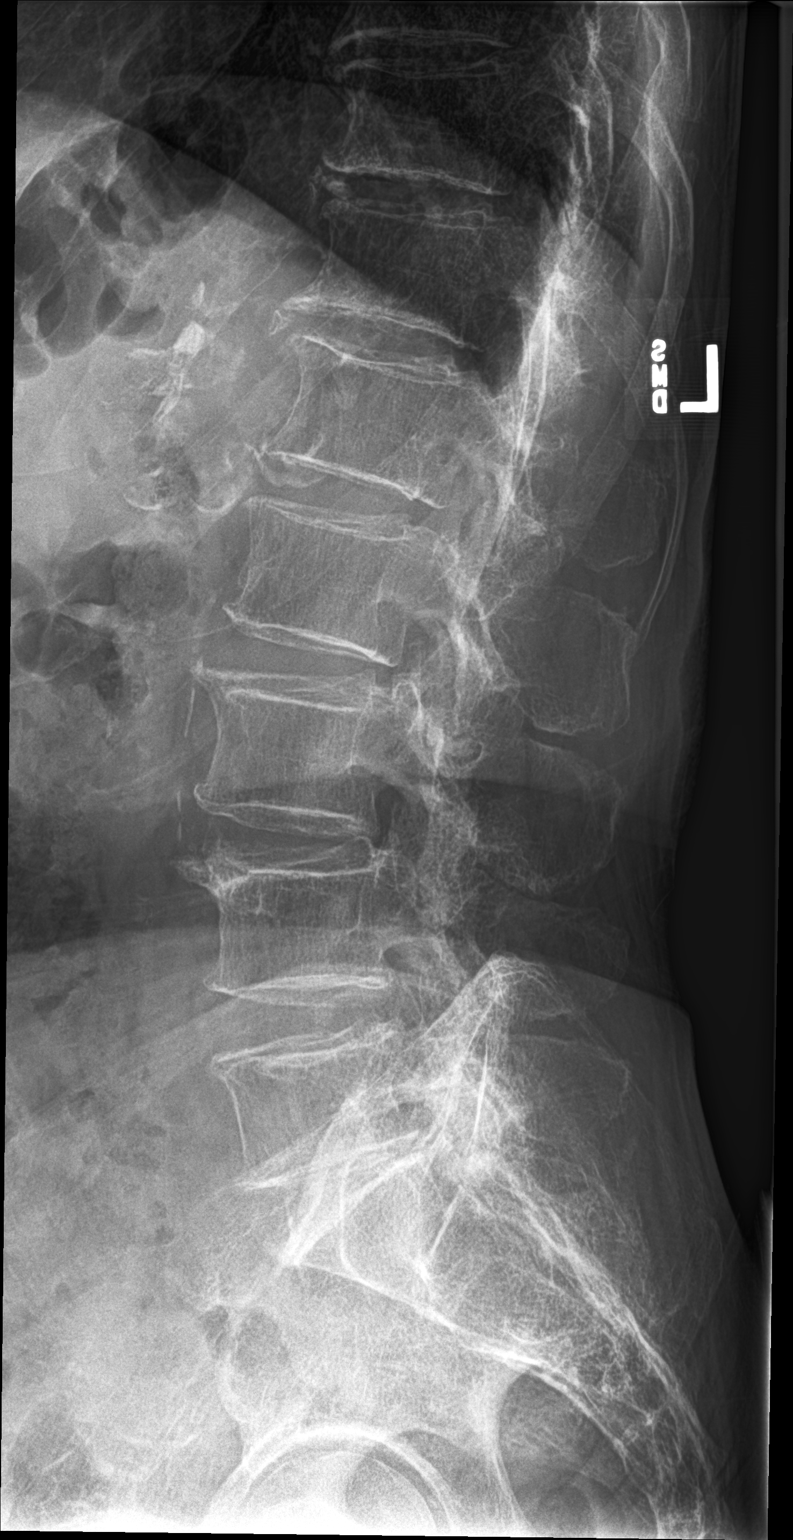

[4 of 4 positions shown; findings below may reference images not displayed]

FINDINGS: Normal lumbar segmentation. Osteopenia. Mild straightening of lumbar
lordosis. Relatively preserved disc spaces. Widespread endplate
spurring. No definite lumbar or lower thoracic compression fracture.
The SI joints and visible sacrum appear intact. Negative abdominal
visceral contours.
IMPRESSION: Osteopenia with no acute osseous abnormality identified. Preserved
disc spaces with endplate spurring.

If occult compression fracture is suspected then noncontrast Lumbar
MRI or Nuclear Medicine Whole-body Bone Scan would evaluate with the
highest sensitivity.

## 2020-04-09 ENCOUNTER — Ambulatory Visit: Payer: Medicare Other | Admitting: Dermatology

## 2020-04-09 ENCOUNTER — Other Ambulatory Visit: Payer: Self-pay

## 2020-04-09 DIAGNOSIS — D485 Neoplasm of uncertain behavior of skin: Secondary | ICD-10-CM

## 2020-04-09 DIAGNOSIS — L578 Other skin changes due to chronic exposure to nonionizing radiation: Secondary | ICD-10-CM | POA: Diagnosis not present

## 2020-04-09 DIAGNOSIS — L570 Actinic keratosis: Secondary | ICD-10-CM

## 2020-04-09 DIAGNOSIS — C44311 Basal cell carcinoma of skin of nose: Secondary | ICD-10-CM | POA: Diagnosis not present

## 2020-04-09 DIAGNOSIS — C4491 Basal cell carcinoma of skin, unspecified: Secondary | ICD-10-CM

## 2020-04-09 DIAGNOSIS — D170 Benign lipomatous neoplasm of skin and subcutaneous tissue of head, face and neck: Secondary | ICD-10-CM | POA: Diagnosis not present

## 2020-04-09 HISTORY — DX: Basal cell carcinoma of skin, unspecified: C44.91

## 2020-04-09 NOTE — Patient Instructions (Signed)
Wound Care Instructions  1. Cleanse wound gently with soap and water once a day then pat dry with clean gauze. Apply a thing coat of Petrolatum (petroleum jelly, "Vaseline") over the wound (unless you have an allergy to this). We recommend that you use a new, sterile tube of Vaseline. Do not pick or remove scabs. Do not remove the yellow or white "healing tissue" from the base of the wound.  2. Cover the wound with fresh, clean, nonstick gauze and secure with paper tape. You may use Band-Aids in place of gauze and tape if the would is small enough, but would recommend trimming much of the tape off as there is often too much. Sometimes Band-Aids can irritate the skin.  3. You should call the office for your biopsy report after 1 week if you have not already been contacted.  4. If you experience any problems, such as abnormal amounts of bleeding, swelling, significant bruising, significant pain, or evidence of infection, please call the office immediately.  5. FOR ADULT SURGERY PATIENTS: If you need something for pain relief you may take 1 extra strength Tylenol (acetaminophen) AND 2 Ibuprofen (200mg each) together every 4 hours as needed for pain. (do not take these if you are allergic to them or if you have a reason you should not take them.) Typically, you may only need pain medication for 1 to 3 days.   Wound Care Instructions  6. Cleanse wound gently with soap and water once a day then pat dry with clean gauze. Apply a thing coat of Petrolatum (petroleum jelly, "Vaseline") over the wound (unless you have an allergy to this). We recommend that you use a new, sterile tube of Vaseline. Do not pick or remove scabs. Do not remove the yellow or white "healing tissue" from the base of the wound.  7. Cover the wound with fresh, clean, nonstick gauze and secure with paper tape. You may use Band-Aids in place of gauze and tape if the would is small enough, but would recommend trimming much of the tape off  as there is often too much. Sometimes Band-Aids can irritate the skin.  8. You should call the office for your biopsy report after 1 week if you have not already been contacted.  9. If you experience any problems, such as abnormal amounts of bleeding, swelling, significant bruising, significant pain, or evidence of infection, please call the office immediately.  10. FOR ADULT SURGERY PATIENTS: If you need something for pain relief you may take 1 extra strength Tylenol (acetaminophen) AND 2 Ibuprofen (200mg each) together every 4 hours as needed for pain. (do not take these if you are allergic to them or if you have a reason you should not take them.) Typically, you may only need pain medication for 1 to 3 days.      

## 2020-04-09 NOTE — Progress Notes (Signed)
Follow-Up Visit   Subjective  Jerry Atkinson is a 86 y.o. male who presents for the following: Actinic Keratosis (x 36 of the face, ears, neck, and L arm. Patient is here to check for any new or persistent skin lesions).  He has had a sore on his nose that bleeds off and on.   The following portions of the chart were reviewed this encounter and updated as appropriate:      Review of Systems:  No other skin or systemic complaints except as noted in HPI or Assessment and Plan.  Objective  Well appearing patient in no apparent distress; mood and affect are within normal limits.  A focused examination was performed including the face, arms, and neck. Relevant physical exam findings are noted in the Assessment and Plan.  Objective  R ear x 4, R neck x 2, R malar cheek x 4, L cheek x 4, L temple x 1, L forehead x 8, post neck x 1, L neck x 3, L post auricular x 1, L ear x 4, L nasal dorsum x 1, R temple x 1, R preauricular x 1, L hand dorsum x 3, L thumb x 1, R wrist x 1 (40): Erythematous thin papules/macules with gritty scale.   Objective  R post neck: 2.5 cm rubbery nodule- present for years no changes per pt  Objective  R ant alar crease: 0.3 cm ulcerated macule       Assessment & Plan  AK (actinic keratosis) (40) R ear x 4, R neck x 2, R malar cheek x 4, L cheek x 4, L temple x 1, L forehead x 8, post neck x 1, L neck x 3, L post auricular x 1, L ear x 4, L nasal dorsum x 1, R temple x 1, R preauricular x 1, L hand dorsum x 3, L thumb x 1, R wrist x 1  Destruction of lesion - R ear x 4, R neck x 2, R malar cheek x 4, L cheek x 4, L temple x 1, L forehead x 8, post neck x 1, L neck x 3, L post auricular x 1, L ear x 4, L nasal dorsum x 1, R temple x 1, R preauricular x 1, L hand dorsum x 3, L thumb x 1, R wrist x 1 Complexity: simple   Destruction method: cryotherapy   Informed consent: discussed and consent obtained   Timeout:  patient name, date of birth, surgical site,  and procedure verified Lesion destroyed using liquid nitrogen: Yes   Region frozen until ice ball extended beyond lesion: Yes   Outcome: patient tolerated procedure well with no complications   Post-procedure details: wound care instructions given    Lipoma of neck R post neck  Benign appearing, observe.  Neoplasm of uncertain behavior of skin R ant alar crease  Skin / nail biopsy Type of biopsy: tangential   Informed consent: discussed and consent obtained   Anesthesia: the lesion was anesthetized in a standard fashion   Anesthesia comment:  Area prepped with alcohol Anesthetic:  1% lidocaine w/ epinephrine 1-100,000 buffered w/ 8.4% NaHCO3 Instrument used: flexible razor blade   Hemostasis achieved with: pressure and aluminum chloride   Outcome: patient tolerated procedure well   Post-procedure details: wound care instructions given   Post-procedure details comment:  Ointment and small bandage applied  Specimen 1 - Surgical pathology Differential Diagnosis: D48.5 AK vs BCC vs other  Check Margins: No 0.3 cm ulcerated macule  Discussed tx options if positive for CA - ED&C vs MOHS vs radiation vs Erivedge.   Actinic Damage - Severe, chronic, secondary to cumulative UV radiation exposure over time - diffuse scaly erythematous macules and papules with underlying dyspigmentation - Discussed "Field Treatment" for Severe, Confluent Actinic Changes with Pre-Cancerous Actinic Keratoses Field treatment involves treatment of an entire area of skin that has confluent Actinic Changes (Sun/ Ultraviolet light damage) and PreCancerous Actinic Keratoses by method of PhotoDynamic Therapy (PDT) and/or prescription Topical Chemotherapy agents such as 5-fluorouracil, 5-fluorouracil/calcipotriene, and/or imiquimod.  The purpose is to decrease the number of clinically evident and subclinical PreCancerous lesions to prevent progression to development of skin cancer by chemically destroying early  precancer changes that may or may not be visible.  It has been shown to reduce the risk of developing skin cancer in the treated area. As a result of treatment, redness, scaling, crusting, and open sores may occur during treatment course. One or more than one of these methods may be used and may have to be used several times to control, suppress and eliminate the PreCancerous changes. Discussed treatment course, expected reaction, and possible side effects. - Recommend daily broad spectrum sunscreen SPF 30+ to sun-exposed areas, reapply every 2 hours as needed.  - Call for new or changing lesions. Patient prefers to do PDT to face.  Rec. No incubation and 30 min illumination due to severity to minimize pain.  Return for nurse visit for PDT on the face x 2 treatments one month apart; AK follow up in 4 months.  Luther Redo, CMA, am acting as scribe for Brendolyn Patty, MD .  Documentation: I have reviewed the above documentation for accuracy and completeness, and I agree with the above.  Brendolyn Patty MD

## 2020-04-16 ENCOUNTER — Telehealth: Payer: Self-pay

## 2020-04-16 NOTE — Telephone Encounter (Signed)
Lft pts son a message to call so we could get pt scheduled for [facility]./sh

## 2020-04-16 NOTE — Telephone Encounter (Signed)
-----   Message from Brendolyn Patty, MD sent at 04/16/2020  4:47 PM EST ----- Skin , r ant alar crease BASAL CELL CARCINOMA, NODULAR AND INFILTRATIVE PATTERNS  BCC skin cancer- discussed treatment options with son.  EDC vrs Mohs vrs radiation.  Son feels like EDC is best option at this time- please schedule.  Discussed higher risk of recurrence.  If it recurs, then will proceed with radiation at that time.

## 2020-04-25 ENCOUNTER — Telehealth: Payer: Self-pay

## 2020-04-25 NOTE — Telephone Encounter (Signed)
-----   Message from Brendolyn Patty, MD sent at 04/16/2020  4:47 PM EST ----- Skin , r ant alar crease BASAL CELL CARCINOMA, NODULAR AND INFILTRATIVE PATTERNS  BCC skin cancer- discussed treatment options with son.  EDC vrs Mohs vrs radiation.  Son feels like EDC is best option at this time- please schedule.  Discussed higher risk of recurrence.  If it recurs, then will proceed with radiation at that time.

## 2020-04-25 NOTE — Telephone Encounter (Signed)
Left pts son message to call so we could schedule pt for [facility] R ant alar crease./sh

## 2020-05-02 ENCOUNTER — Telehealth: Payer: Self-pay

## 2020-05-02 NOTE — Telephone Encounter (Signed)
Left patient a message to call to schedule appt to txt [facility] of R ant alar crease./sh

## 2020-05-02 NOTE — Telephone Encounter (Signed)
-----   Message from Tara Stewart, MD sent at 04/16/2020  4:47 PM EST ----- Skin , r ant alar crease BASAL CELL CARCINOMA, NODULAR AND INFILTRATIVE PATTERNS  BCC skin cancer- discussed treatment options with son.  EDC vrs Mohs vrs radiation.  Son feels like EDC is best option at this time- please schedule.  Discussed higher risk of recurrence.  If it recurs, then will proceed with radiation at that time.   

## 2020-05-24 ENCOUNTER — Other Ambulatory Visit: Payer: Self-pay

## 2020-05-24 ENCOUNTER — Ambulatory Visit: Payer: Medicare Other

## 2020-06-18 ENCOUNTER — Ambulatory Visit (INDEPENDENT_AMBULATORY_CARE_PROVIDER_SITE_OTHER): Payer: Medicare Other | Admitting: Dermatology

## 2020-06-18 ENCOUNTER — Encounter: Payer: Self-pay | Admitting: Dermatology

## 2020-06-18 ENCOUNTER — Other Ambulatory Visit: Payer: Self-pay

## 2020-06-18 DIAGNOSIS — C44311 Basal cell carcinoma of skin of nose: Secondary | ICD-10-CM

## 2020-06-18 DIAGNOSIS — L578 Other skin changes due to chronic exposure to nonionizing radiation: Secondary | ICD-10-CM | POA: Diagnosis not present

## 2020-06-18 NOTE — Progress Notes (Signed)
   Follow-Up Visit   Subjective  Jerry Atkinson is a 87 y.o. male who presents for the following: BCC bx proven (R ant alar crease, pt presents for treatment.).  Patient accompanied by son who contributes to history.  The following portions of the chart were reviewed this encounter and updated as appropriate:       Review of Systems:  No other skin or systemic complaints except as noted in HPI or Assessment and Plan.  Objective  Well appearing patient in no apparent distress; mood and affect are within normal limits.  A focused examination was performed including face. Relevant physical exam findings are noted in the Assessment and Plan.  Objective  R ant alar crease: Pink bx site   Assessment & Plan  Basal cell carcinoma (BCC) of skin of nose R ant alar crease  Destruction of lesion  Destruction method: electrodesiccation and curettage   Informed consent: discussed and consent obtained   Timeout:  patient name, date of birth, surgical site, and procedure verified Curettage performed in three different directions: Yes   Electrodesiccation performed over the curetted area: Yes   Lesion length (cm):  0.3 Lesion width (cm):  0.3 Margin per side (cm):  0.1 Final wound size (cm):  0.5 Hemostasis achieved with:  pressure, aluminum chloride and electrodesiccation Outcome: patient tolerated procedure well with no complications   Post-procedure details: wound care instructions given   Additional details:  Mupirocin ointment and Bandaid applied    Actinic Damage - chronic, secondary to cumulative UV radiation exposure/sun exposure over time - diffuse scaly erythematous macules with underlying dyspigmentation - Recommend daily broad spectrum sunscreen SPF 30+ to sun-exposed areas, reapply every 2 hours as needed.  - Call for new or changing lesions.  Return for as scheduled for AK f/u.   I, Othelia Pulling, RMA, am acting as scribe for Brendolyn Patty, MD . Documentation: I have  reviewed the above documentation for accuracy and completeness, and I agree with the above.  Brendolyn Patty MD

## 2020-06-18 NOTE — Patient Instructions (Signed)

## 2020-06-21 ENCOUNTER — Ambulatory Visit: Payer: Medicare Other

## 2020-08-07 ENCOUNTER — Ambulatory Visit (INDEPENDENT_AMBULATORY_CARE_PROVIDER_SITE_OTHER): Payer: Medicare Other | Admitting: Dermatology

## 2020-08-07 ENCOUNTER — Other Ambulatory Visit: Payer: Self-pay

## 2020-08-07 ENCOUNTER — Encounter: Payer: Self-pay | Admitting: Dermatology

## 2020-08-07 DIAGNOSIS — L578 Other skin changes due to chronic exposure to nonionizing radiation: Secondary | ICD-10-CM

## 2020-08-07 DIAGNOSIS — I781 Nevus, non-neoplastic: Secondary | ICD-10-CM | POA: Diagnosis not present

## 2020-08-07 DIAGNOSIS — L570 Actinic keratosis: Secondary | ICD-10-CM

## 2020-08-07 DIAGNOSIS — Z85828 Personal history of other malignant neoplasm of skin: Secondary | ICD-10-CM | POA: Diagnosis not present

## 2020-08-07 NOTE — Progress Notes (Signed)
Follow-Up Visit   Subjective  Jerry Atkinson is a 87 y.o. male who presents for the following: Follow-up (Patient here for follow-up Aks of the face. He also has a history of BCC of the R ant alar crease treated with EDC on 06/18/2020. He has a few new spots on his face to check today.).   The following portions of the chart were reviewed this encounter and updated as appropriate:       Review of Systems:  No other skin or systemic complaints except as noted in HPI or Assessment and Plan.  Objective  Well appearing patient in no apparent distress; mood and affect are within normal limits.  A focused examination was performed including face, neck, arms, hands. Relevant physical exam findings are noted in the Assessment and Plan.  Objective  R forearm x 3, L wrist x 2, R hand x 2 (7), R nasal dorsum x 1, R post neck x 1, R ear helix x 1, R malar cheek x 3, R mid cheek x 1, L temple x 2, L eyebrow x 1, L inf jaw x 1, L nasal tip x 1, R forehead x 2, L antihelix x 1, L upper neck x 3, L mid cheek x 1 (19): Erythematous thin papules/macules with gritty scale.   Objective  Right anterior alar crease: Well healed scar with no evidence of recurrence.   Objective  Right Malar Cheek: Blanching pink macule   Assessment & Plan  AK (actinic keratosis) (26) R forearm x 3, L wrist x 2, R hand x 2 (7); R nasal dorsum x 1, R post neck x 1, R ear helix x 1, R malar cheek x 3, R mid cheek x 1, L temple x 2, L eyebrow x 1, L inf jaw x 1, L nasal tip x 1, R forehead x 2, L antihelix x 1, L upper neck x 3, L mid cheek x 1 (19)  Prior to procedure, discussed risks of blister formation, small wound, skin dyspigmentation, or rare scar following cryotherapy.    Destruction of lesion - R forearm x 3, L wrist x 2, R hand x 2, R nasal dorsum x 1, R post neck x 1, R ear helix x 1, R malar cheek x 3, R mid cheek x 1, L temple x 2, L eyebrow x 1, L inf jaw x 1, L nasal tip x 1, R forehead x 2, L antihelix x  1, L upper neck x 3, L mid cheek x 1  Destruction method: cryotherapy   Informed consent: discussed and consent obtained   Lesion destroyed using liquid nitrogen: Yes   Region frozen until ice ball extended beyond lesion: Yes   Outcome: patient tolerated procedure well with no complications   Post-procedure details: wound care instructions given    History of basal cell carcinoma (BCC) Right anterior alar crease  Clear. Observe for recurrence. Call clinic for new or changing lesions.  Recommend regular skin exams, daily broad-spectrum spf 30+ sunscreen use, and photoprotection.     Telangiectasias Right Malar Cheek  Benign, observe.    Actinic Damage - chronic, secondary to cumulative UV radiation exposure/sun exposure over time - diffuse scaly erythematous macules with underlying dyspigmentation - Recommend daily broad spectrum sunscreen SPF 30+ to sun-exposed areas, reapply every 2 hours as needed.  - Recommend staying in the shade or wearing long sleeves, sun glasses (UVA+UVB protection) and wide brim hats (4-inch brim around the entire circumference of the hat). -  Call for new or changing lesions.  Return in about 6 months (around 02/07/2021) for UBSE, h/o AKs, BCC.   IJamesetta Orleans, CMA, am acting as scribe for Brendolyn Patty, MD .  Documentation: I have reviewed the above documentation for accuracy and completeness, and I agree with the above.  Brendolyn Patty MD

## 2020-08-07 NOTE — Patient Instructions (Addendum)
Cryotherapy Aftercare  . Wash gently with soap and water everyday.   Marland Kitchen Apply Vaseline and Band-Aid daily until healed.  Recommend daily broad spectrum sunscreen SPF 30+ to sun-exposed areas, reapply every 2 hours as needed. Call for new or changing lesions.  Staying in the shade or wearing long sleeves, sun glasses (UVA+UVB protection) and wide brim hats (4-inch brim around the entire circumference of the hat) are also recommended for sun protection.    If you have any questions or concerns for your doctor, please call our main line at [phone] and press option 4 to reach your doctor's medical assistant. If no one answers, please leave a voicemail as directed and we will return your call as soon as possible. Messages left after 4 pm will be answered the following business day.   You may also send [place] a message via [place]. We typically respond to MyChart messages within 1-2 business days.  For prescription refills, please ask your pharmacy to contact our office. Our fax number is [phone].  If you have an urgent issue when the clinic is closed that cannot wait until the next business day, you can page your doctor at the number below.    Please note that while we do our best to be available for urgent issues outside of office hours, we are not available 24/7.   If you have an urgent issue and are unable to reach [place], you may choose to seek medical care at your doctor's office, retail clinic, urgent care center, or emergency room.  If you have a medical emergency, please immediately call 911 or go to the emergency department.  Pager Numbers  - Dr. Nehemiah Massed: [phone]  - Dr. Laurence Ferrari: [phone]  - Dr. Nicole Kindred: [phone]  In the event of inclement weather, please call our main line at [phone] for an update on the status of any delays or closures.  Dermatology Medication Tips: Please keep the boxes that topical medications come in in order to help keep track of the  instructions about where and how to use these. Pharmacies typically print the medication instructions only on the boxes and not directly on the medication tubes.   If your medication is too expensive, please contact our office at [phone] option 4 or send [place] a message through [place].   We are unable to tell what your co-pay for medications will be in advance as this is different depending on your insurance coverage. However, we may be able to find a substitute medication at lower cost or fill out paperwork to get insurance to cover a needed medication.   If a prior authorization is required to get your medication covered by your insurance company, please allow [place] 1-2 business days to complete this process.  Drug prices often vary depending on where the prescription is filled and some pharmacies may offer cheaper prices.  The website www.goodrx.com contains coupons for medications through different pharmacies. The prices here do not account for what the cost may be with help from insurance (it may be cheaper with your insurance), but the website can give you the price if you did not use any insurance.  - You can print the associated coupon and take it with your prescription to the pharmacy.  - You may also stop by our office during regular business hours and pick up a GoodRx coupon card.  - If you need your prescription sent electronically to a different pharmacy, notify our office through [facility] or by phone at  [phone] option 4.

## 2020-11-27 ENCOUNTER — Encounter: Payer: Self-pay | Admitting: Emergency Medicine

## 2020-11-27 ENCOUNTER — Other Ambulatory Visit: Payer: Self-pay

## 2020-11-27 ENCOUNTER — Ambulatory Visit
Admission: EM | Admit: 2020-11-27 | Discharge: 2020-11-27 | Disposition: A | Discharge status: Home / Self Care | Payer: Medicare Other | Attending: Family Medicine | Admitting: Family Medicine

## 2020-11-27 DIAGNOSIS — U071 COVID-19: Secondary | ICD-10-CM

## 2020-11-27 MED ORDER — MOLNUPIRAVIR EUA 200MG CAPSULE: 4.0000 | ORAL_CAPSULE | Freq: Two times a day (BID) | ORAL | 0 refills | Status: AC

## 2020-11-27 NOTE — ED Triage Notes (Signed)
Pt son states pt started having cough, sore throat, sunny nose, chills. Started 2 days ago. He took a home test and was positive. Wife is also positive.

## 2020-11-27 NOTE — Discharge Instructions (Addendum)
If he worsens, please let us know.  Continue his home meds as usual.  Take care  Dr. Lacinda Axon

## 2020-11-27 NOTE — ED Provider Notes (Signed)
[facility]    CSN: [id number] Arrival date & time: 11/27/20  1052      History   Chief Complaint Chief Complaint  Patient presents with   Covid Positive   Cough    HPI 87 year old male presents for evaluation the above.  Patient has had symptoms for the past 2 days.  He has had sore throat, cough, runny nose.  He states that he is feeling well at this time.  He has tested positive for COVID at home.  His wife is also positive.  He is accompanied by his son today.  They desire treatment given his age and comorbidity.  No documented fever.  No other associated symptoms.  No other complaints or concerns at this time.  Past Medical History:  Diagnosis Date   Actinic keratosis    Basal cell carcinoma 04/09/2020    R ant alar crease, EDC 06/18/20   BPH (benign prostatic hypertrophy)    GERD (gastroesophageal reflux disease)    Hypercholesteremia    Hypertension     Patient Active Problem List   Diagnosis Date Noted   Paroxysmal atrial fibrillation ([place]) 02/01/2017   Carotid atherosclerosis, bilateral 12/23/2016   Benign localized hyperplasia of prostate with urinary obstruction 11/10/2016   History of nephrolithiasis 11/10/2016   Incomplete emptying of bladder 11/10/2016   Urge incontinence 11/10/2016   Primary osteoarthritis of both knees 02/04/2016   B12 deficiency 09/06/2015   Diabetic polyneuropathy associated with type 2 diabetes mellitus ([place]) 09/06/2015   H/O myasthenia gravis 09/06/2015   Right leg weakness 09/06/2015   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   High risk medication use 07/30/2014   Status post total right knee replacement 03/12/2014   Benign essential hypertension 02/02/2014   Benign prostatic hyperplasia 02/02/2014   GERD (gastroesophageal reflux disease) 02/02/2014   Hyperlipidemia associated with type 2 diabetes mellitus ([place]) 02/02/2014   Perennial allergic rhinitis 02/02/2014   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 Authorizing Provider  acetaminophen (TYLENOL) 325 MG tablet Take by mouth.   Yes [provider]  aspirin 81 MG tablet Take 81 mg by mouth daily.   Yes [provider]  Aspirin-Calcium Carbonate 81-777 MG TABS Take by mouth.   Yes [provider]  Blood Glucose Calibration (ACCU-CHEK GUIDE CONTROL) LIQD  06/30/17  Yes [provider]  cyanocobalamin 1000 MCG tablet Take 1,000 mcg by mouth daily.   Yes [provider]  ELIQUIS 5 MG TABS tablet  09/14/18  Yes [provider]  finasteride (PROSCAR) 5 MG tablet Take 5 mg by mouth daily.   Yes [provider]  melatonin 3 MG TABS tablet Take 3 mg by mouth at bedtime.   Yes [provider]  molnupiravir EUA 200 mg CAPS Take 4 capsules (800 mg total) by mouth 2 (two) times daily for 5 days. 11/27/20 12/02/20 Yes Lakeidra Reliford G, DO  Multiple Vitamins-Minerals (ICAPS AREDS 2 PO) Take by mouth 2 (two) times daily.    Yes [provider]  simvastatin (  ZOCOR) 20 MG tablet Take 20 mg by mouth daily.   Yes [provider]  tamsulosin (FLOMAX) 0.4 MG CAPS capsule Take 0.4 mg by mouth daily. 07/21/17  Yes [provider]  felodipine (PLENDIL) 5 MG 24 hr tablet Take 5 mg by mouth daily. Patient not taking: Reported on 11/15/2019    [provider]  ramipril (ALTACE) 10 MG capsule Take by mouth. 02/04/18   [provider]  senna-docusate (SENOKOT-S) 8.6-50 MG tablet Take 1 tablet by mouth 2 (two)  times daily. Patient not taking: Reported on 11/15/2019    [provider]    Family History History reviewed. No pertinent family history.  Social History Social History   Tobacco Use   Smoking status: Never   Smokeless tobacco: Never  Vaping Use   Vaping Use: Never used  Substance Use Topics   Alcohol use: No     Allergies   Patient has no known allergies.   Review of Systems Review of Systems Per HPI  Physical Exam Triage Vital Signs ED Triage Vitals  Enc Vitals Group     BP 11/27/20 1108 (!) 146/70     Pulse Rate 11/27/20 1108 62     Resp 11/27/20 1108 18     Temp 11/27/20 1108 98.1 F (36.7 C)     Temp Source 11/27/20 1108 Oral     SpO2 11/27/20 1108 97 %     Weight 11/27/20 1104 138 lb 0.1 oz (62.6 kg)     Height 11/27/20 1104 5\' 5"  (1.651 m)     Head Circumference --      Peak Flow --      Pain Score 11/27/20 1104 0     Pain Loc --      Pain Edu? --      Excl. in [place]? --    No data found.  Updated Vital Signs BP (!) 146/70 (BP Location: Left Arm)   Pulse 62   Temp 98.1 F (36.7 C) (Oral)   Resp 18   Ht 5\' 5"  (1.651 m)   Wt 62.6 kg   SpO2 97%   BMI 22.97 kg/m   Visual Acuity Right Eye Distance:   Left Eye Distance:   Bilateral Distance:    Right Eye Near:   Left Eye Near:    Bilateral Near:     Physical Exam Vitals and nursing note reviewed.  Constitutional:      General: He is not in acute distress.    Appearance: Normal appearance. He is not ill-appearing.  HENT:     Head: Normocephalic and atraumatic.     Mouth/Throat:     Pharynx: Oropharynx is clear.  Eyes:     General:        Right eye: No discharge.        Left eye: No discharge.     Conjunctiva/sclera: Conjunctivae normal.  Cardiovascular:     Rate and Rhythm: Normal rate and regular rhythm.  Pulmonary:     Effort: Pulmonary effort is normal.     Breath sounds: Normal breath sounds. No wheezing, rhonchi or rales.  Neurological:     Mental Status: He is  alert.  Psychiatric:        Mood and Affect: Mood normal.        Behavior: Behavior normal.     UC Treatments / Results  Labs (all labs ordered are listed, but only abnormal results are displayed) Labs Reviewed - No data to display  EKG  Radiology No results found.  Procedures Procedures (including critical care time)  Medications Ordered in UC Medications - No data to display  Initial Impression / Assessment and Plan / UC Course  I have reviewed the triage vital signs and the nursing notes.  Pertinent labs & imaging results that were available during my care of the patient were reviewed by me and considered in my medical decision making (see chart for details).    87 year old male presents with COVID-19.  He is overall well-appearing.  Given the fact that he is on Eliquis and Paxlovid interferes with Eliquis, I am treating him with molnupiravir.  Final Clinical Impressions(s) / UC Diagnoses   Final diagnoses:  COVID     Discharge Instructions      If he worsens, please let us know.  Continue his home meds as usual.  Take care  Dr. Lacinda Axon    ED Prescriptions     Medication Sig [place]. Provider   molnupiravir EUA 200 mg CAPS Take 4 capsules (800 mg total) by mouth 2 (two) times daily for 5 days. 40 capsule Coral Spikes, DO      PDMP not reviewed this encounter.   Coral Spikes, DO 11/27/20 1124

## 2021-01-29 ENCOUNTER — Ambulatory Visit: Payer: Medicare Other | Admitting: Dermatology

## 2021-02-13 ENCOUNTER — Ambulatory Visit: Payer: Medicare Other | Admitting: Dermatology

## 2021-02-18 ENCOUNTER — Ambulatory Visit: Payer: Medicare Other | Admitting: Dermatology

## 2021-03-18 ENCOUNTER — Ambulatory Visit: Payer: Medicare Other | Admitting: Dermatology

## 2021-06-26 ENCOUNTER — Encounter: Payer: Self-pay | Admitting: Ophthalmology

## 2021-06-27 NOTE — Discharge Instructions (Signed)

## 2021-07-01 ENCOUNTER — Ambulatory Visit: Payer: Medicare Other | Admitting: Anesthesiology

## 2021-07-01 ENCOUNTER — Ambulatory Visit
Admission: RE | Admit: 2021-07-01 | Discharge: 2021-07-01 | Disposition: A | Discharge status: Home / Self Care | Payer: Medicare Other | Attending: Ophthalmology | Admitting: Ophthalmology

## 2021-07-01 ENCOUNTER — Encounter: Payer: Self-pay | Admitting: Ophthalmology

## 2021-07-01 ENCOUNTER — Encounter
Admission: RE | Disposition: A | Discharge status: Home / Self Care | Payer: Self-pay | Source: Home / Self Care | Attending: Ophthalmology

## 2021-07-01 ENCOUNTER — Other Ambulatory Visit: Payer: Self-pay

## 2021-07-01 DIAGNOSIS — E119 Type 2 diabetes mellitus without complications: Secondary | ICD-10-CM | POA: Diagnosis not present

## 2021-07-01 DIAGNOSIS — H2512 Age-related nuclear cataract, left eye: Secondary | ICD-10-CM | POA: Insufficient documentation

## 2021-07-01 DIAGNOSIS — H2181 Floppy iris syndrome: Secondary | ICD-10-CM | POA: Insufficient documentation

## 2021-07-01 DIAGNOSIS — I10 Essential (primary) hypertension: Secondary | ICD-10-CM | POA: Diagnosis not present

## 2021-07-01 HISTORY — DX: Presence of external hearing-aid: Z97.4

## 2021-07-01 HISTORY — DX: Type 2 diabetes mellitus without complications: E11.9

## 2021-07-01 HISTORY — PX: CATARACT EXTRACTION W/PHACO: SHX586

## 2021-07-01 HISTORY — DX: Presence of dental prosthetic device (complete) (partial): Z97.2

## 2021-07-01 LAB — GLUCOSE, CAPILLARY
Glucose-Capillary: 105 mg/dL — ABNORMAL HIGH (ref 70–99)
Glucose-Capillary: 114 mg/dL — ABNORMAL HIGH (ref 70–99)

## 2021-07-01 SURGERY — PHACOEMULSIFICATION, CATARACT, WITH IOL INSERTION
Anesthesia: Monitor Anesthesia Care | Site: Eye | Laterality: Left

## 2021-07-01 MED ORDER — LACTATED RINGERS IV SOLN: INTRAVENOUS | Status: DC

## 2021-07-01 MED ORDER — FENTANYL CITRATE (PF) 100 MCG/2ML IJ SOLN
INTRAMUSCULAR | Status: DC | PRN
  Administered 2021-07-01: 50 ug via INTRAVENOUS

## 2021-07-01 MED ORDER — ACETAMINOPHEN 160 MG/5ML PO SOLN: 325.0000 mg | ORAL | Status: DC | PRN

## 2021-07-01 MED ORDER — SIGHTPATH DOSE#1 BSS IO SOLN
INTRAOCULAR | Status: DC | PRN
  Administered 2021-07-01: 108 mL via OPHTHALMIC

## 2021-07-01 MED ORDER — MIDAZOLAM HCL 2 MG/2ML IJ SOLN
INTRAMUSCULAR | Status: DC | PRN
Start: 2021-07-01 — End: 2021-07-01
  Administered 2021-07-01: .5 mg via INTRAVENOUS

## 2021-07-01 MED ORDER — ARMC OPHTHALMIC DILATING DROPS
1.0000 "application " | OPHTHALMIC | Status: DC | PRN
  Administered 2021-07-01 (×3): 1 via OPHTHALMIC

## 2021-07-01 MED ORDER — MOXIFLOXACIN HCL 0.5 % OP SOLN
OPHTHALMIC | Status: DC | PRN
  Administered 2021-07-01: 0.2 mL via OPHTHALMIC

## 2021-07-01 MED ORDER — TETRACAINE HCL 0.5 % OP SOLN
1.0000 [drp] | OPHTHALMIC | Status: DC | PRN
  Administered 2021-07-01 (×3): 1 [drp] via OPHTHALMIC

## 2021-07-01 MED ORDER — ACETAMINOPHEN 325 MG PO TABS: 325.0000 mg | ORAL_TABLET | ORAL | Status: DC | PRN

## 2021-07-01 MED ORDER — SIGHTPATH DOSE#1 SODIUM HYALURONATE 10 MG/ML IO SOLUTION
PREFILLED_SYRINGE | INTRAOCULAR | Status: DC | PRN
  Administered 2021-07-01: 0.85 mL via INTRAOCULAR

## 2021-07-01 MED ORDER — SIGHTPATH DOSE#1 BSS IO SOLN
INTRAOCULAR | Status: DC | PRN
  Administered 2021-07-01: 15 mL

## 2021-07-01 MED ORDER — LIDOCAINE HCL (PF) 2 % IJ SOLN
INTRAOCULAR | Status: DC | PRN
  Administered 2021-07-01: 1 mL via INTRAOCULAR

## 2021-07-01 MED ORDER — SIGHTPATH DOSE#1 SODIUM HYALURONATE 23 MG/ML IO SOLUTION
PREFILLED_SYRINGE | INTRAOCULAR | Status: DC | PRN
  Administered 2021-07-01: 0.6 mL via INTRAOCULAR

## 2021-07-01 SURGICAL SUPPLY — 15 items
CATARACT SUITE SIGHTPATH (MISCELLANEOUS) ×2 IMPLANT
DISSECTOR HYDRO NUCLEUS 50X22 (MISCELLANEOUS) ×2 IMPLANT
FEE CATARACT SUITE SIGHTPATH (MISCELLANEOUS) ×1 IMPLANT
GLOVE SURG GAMMEX PI TX LF 7.5 (GLOVE) ×2 IMPLANT
GLOVE SURG SYN 8.5  E (GLOVE) ×1
GLOVE SURG SYN 8.5 E (GLOVE) ×1 IMPLANT
GLOVE SURG SYN 8.5 PF PI (GLOVE) ×1 IMPLANT
LENS IOL ACRYSOF IQ 22.0 (Intraocular Lens) ×1 IMPLANT
NDL FILTER BLUNT 18X1 1/2 (NEEDLE) ×1 IMPLANT
NEEDLE FILTER BLUNT 18X 1/2SAF (NEEDLE) ×1
NEEDLE FILTER BLUNT 18X1 1/2 (NEEDLE) ×1 IMPLANT
RING MALYGIN (MISCELLANEOUS) ×1 IMPLANT
SYR 3ML LL SCALE MARK (SYRINGE) ×2 IMPLANT
SYR 5ML LL (SYRINGE) ×2 IMPLANT
WATER STERILE IRR 250ML POUR (IV SOLUTION) ×2 IMPLANT

## 2021-07-01 NOTE — Anesthesia Postprocedure Evaluation (Signed)
Anesthesia Post Note  Patient: Jerry Atkinson  Procedure(s) Performed: CATARACT EXTRACTION PHACO AND INTRAOCULAR LENS PLACEMENT (IOC) LEFT DIABETIC (Left: Eye)     Patient location during evaluation: PACU Anesthesia Type: MAC Level of consciousness: awake and alert Pain management: pain level controlled Vital Signs Assessment: post-procedure vital signs reviewed and stable Respiratory status: spontaneous breathing, nonlabored ventilation, respiratory function stable and patient connected to nasal cannula oxygen Cardiovascular status: stable and blood pressure returned to baseline Postop Assessment: no apparent nausea or vomiting Anesthetic complications: no   No notable events documented.  Trecia Rogers

## 2021-07-01 NOTE — Anesthesia Preprocedure Evaluation (Signed)
Anesthesia Evaluation  Patient identified by MRN, date of birth, ID band Patient awake    Reviewed: Allergy & Precautions, H&P , NPO status , Patient's Chart, lab work & pertinent test results, reviewed documented beta blocker date and time   Airway Mallampati: I  TM Distance: >3 FB Neck ROM: full    Dental no notable dental hx.    Pulmonary neg pulmonary ROS,    Pulmonary exam normal breath sounds clear to auscultation       Cardiovascular Exercise Tolerance: Good hypertension, Normal cardiovascular exam Rhythm:regular Rate:Normal     Neuro/Psych negative neurological ROS  negative psych ROS   GI/Hepatic Neg liver ROS, GERD  ,  Endo/Other  diabetes, Type 2  Renal/GU negative Renal ROS  negative genitourinary   Musculoskeletal   Abdominal   Peds  Hematology negative hematology ROS (+)   Anesthesia Other Findings   Reproductive/Obstetrics negative OB ROS                             Anesthesia Physical Anesthesia Plan  ASA: 2  Anesthesia Plan: MAC   Post-op Pain Management:    Induction:   PONV Risk Score and Plan:   Airway Management Planned:   Additional Equipment:   Intra-op Plan:   Post-operative Plan:   Informed Consent: I have reviewed the patients History and Physical, chart, labs and discussed the procedure including the risks, benefits and alternatives for the proposed anesthesia with the patient or authorized representative who has indicated his/her understanding and acceptance.     Dental Advisory Given  Plan Discussed with: CRNA and Anesthesiologist  Anesthesia Plan Comments:         Anesthesia Quick Evaluation

## 2021-07-01 NOTE — Transfer of Care (Signed)
Immediate Anesthesia Transfer of Care Note  Patient: Jerry Atkinson  Procedure(s) Performed: CATARACT EXTRACTION PHACO AND INTRAOCULAR LENS PLACEMENT (IOC) LEFT DIABETIC (Left: Eye)  Patient Location: PACU  Anesthesia Type: MAC  Level of Consciousness: awake, alert  and patient cooperative  Airway and Oxygen Therapy: Patient Spontanous Breathing and Patient connected to supplemental oxygen  Post-op Assessment: Post-op Vital signs reviewed, Patient's Cardiovascular Status Stable, Respiratory Function Stable, Patent Airway and No signs of Nausea or vomiting  Post-op Vital Signs: Reviewed and stable  Complications: No notable events documented.

## 2021-07-01 NOTE — Op Note (Signed)
OPERATIVE NOTE  Jerry Atkinson [id number] 07/01/2021   PREOPERATIVE DIAGNOSIS:  Nuclear sclerotic cataract left eye.  H25.12   POSTOPERATIVE DIAGNOSIS:      Nuclear sclerotic cataract left eye.   Intraoperative floppy iris syndrome.   PROCEDURE:  Complex Phacoemusification with posterior chamber intraocular lens placement of the left eye, requiring malyugin ring for expansion and stabilization of the iris.  CPT [phone]  LENS:   Implant Name Type Inv. Item Serial No. Manufacturer Lot No. LRB No. Used Action  [employer] IQ    [id number] ALCON  Left 1 Implanted      Procedure(s) with comments: CATARACT EXTRACTION PHACO AND INTRAOCULAR LENS PLACEMENT (IOC) LEFT DIABETIC (Left) - Diabetic 7.49 00:53.9  AU00T0 +22.0   ULTRASOUND TIME: 0 minutes 53 seconds.  CDE 7.49   SURGEON:  Benay Pillow, MD, MPH   ANESTHESIA:  Topical with tetracaine drops augmented with 1% preservative-free intracameral lidocaine.  ESTIMATED BLOOD LOSS: <1 mL   COMPLICATIONS:  None.   DESCRIPTION OF PROCEDURE:  The patient was identified in the holding room and transported to the operating room and placed in the supine position under the operating microscope.  The left eye was identified as the operative eye and it was prepped and draped in the usual sterile ophthalmic fashion.   A 1.0 millimeter clear-corneal paracentesis was made at the 5:00 position. 0.5 ml of preservative-free 1% lidocaine with epinephrine was injected into the anterior chamber.  The anterior chamber was filled with Healon 5 viscoelastic.  A 2.4 millimeter keratome was used to make a near-clear corneal incision at the 2:00 position.    The pupil was small and the iris was floppy so a 6.25 mm Malyugin ring was placed.  A curvilinear capsulorrhexis was made with a cystotome and capsulorrhexis forceps.  Balanced salt solution was used to hydrodissect and hydrodelineate the nucleus.   Phacoemulsification was then used in stop and chop fashion to  remove the lens nucleus and epinucleus.  The remaining cortex was then removed using the irrigation and aspiration handpiece. Healon was then placed into the capsular bag to distend it for lens placement.  A lens was then injected into the capsular bag.  The malyugin ring was removed.  The remaining viscoelastic was aspirated.   Wounds were hydrated with balanced salt solution.  The anterior chamber was inflated to a physiologic pressure with balanced salt solution.  Intracameral vigamox 0.1 mL undiltued was injected into the eye and a drop placed onto the ocular surface.  No wound leaks were noted.  The patient was taken to the recovery room in stable condition without complications of anesthesia or surgery  Benay Pillow 07/01/2021, 8:54 AM

## 2021-07-01 NOTE — H&P (Signed)
[facility]   Primary Care Physician:  [place] Ophthalmologist: Dr. Benay Pillow  Pre-Procedure History & Physical: HPI:  Jerry Atkinson is a 88 y.o. male here for cataract surgery.   Past Medical History:  Diagnosis Date   Actinic keratosis    Basal cell carcinoma 04/09/2020    R ant alar crease, EDC 06/18/20   BPH (benign prostatic hypertrophy)    Diabetes mellitus, type 2 (HCC)    GERD (gastroesophageal reflux disease)    Hypercholesteremia    Hypertension    Wears dentures    full upper   Wears hearing aid in both ears     Past Surgical History:  Procedure Laterality Date   CATARACT EXTRACTION W/PHACO Right 09/10/2015   Procedure: CATARACT EXTRACTION PHACO AND INTRAOCULAR LENS PLACEMENT ([place]);  Surgeon: Ronnell Freshwater, MD;  Location: [facility];  Service: Ophthalmology;  Laterality: Right;  LEAVE MARTINS TOGETHER   REPLACEMENT TOTAL KNEE      Prior to Admission medications   Medication Sig Start Date End Date Taking? Authorizing Provider  acetaminophen (TYLENOL) 325 MG tablet Take by mouth.   Yes [provider]  aspirin 81 MG tablet Take 81 mg by mouth daily.   Yes [provider]  cyanocobalamin 1000 MCG tablet Take 1,000 mcg by mouth daily.   Yes [provider]  ELIQUIS 5 MG TABS tablet  09/14/18  Yes [provider]  finasteride (PROSCAR) 5 MG tablet Take 5 mg by mouth daily.   Yes [provider]  glimepiride (AMARYL) 1 MG tablet Take 1 mg by mouth daily.   Yes [provider]  ramipril (ALTACE) 10 MG capsule Take by mouth. 02/04/18  Yes [provider]  simvastatin (ZOCOR) 20 MG tablet Take 20 mg by mouth daily.   Yes [provider]  tamsulosin (FLOMAX) 0.4 MG CAPS capsule Take 0.4 mg by mouth daily. 07/21/17  Yes [provider]  Blood Glucose Calibration (ACCU-CHEK GUIDE CONTROL) LIQD  06/30/17   [provider]    Allergies as of  05/27/2021   (No Known Allergies)    History reviewed. No pertinent family history.  Social History   Socioeconomic History   Marital status: Married    Spouse name: Not on file   Number of children: Not on file   Years of education: Not on file   Highest education level: Not on file  Occupational History   Not on file  Tobacco Use   Smoking status: Never   Smokeless tobacco: Never  Vaping Use   Vaping Use: Never used  Substance and Sexual Activity   Alcohol use: No   Drug use: Not on file   Sexual activity: Not on file  Other Topics Concern   Not on file  Social History Narrative   Not on file   Social Determinants of Health   Financial Resource Strain: Not on file  Food Insecurity: Not on file  Transportation Needs: Not on file  Physical Activity: Not on file  Stress: Not on file  Social Connections: Not on file  Intimate Partner Violence: Not on file    Review of Systems: See HPI, otherwise negative ROS  Physical Exam: BP (!) 166/77    Pulse 62    Temp 97.7 F (36.5 C) (Temporal)    Ht 5\' 5"  (1.651 m)    Wt 57.8 kg    SpO2 100%    BMI 21.22 kg/m  General:   Alert, cooperative  in NAD Head:  Normocephalic and atraumatic. Respiratory:  Normal work of breathing. Cardiovascular:  RRR  Impression/Plan: Jerry Atkinson is here for cataract surgery.  Risks, benefits, limitations, and alternatives regarding cataract surgery have been reviewed with the patient.  Questions have been answered.  All parties agreeable.   Benay Pillow, MD  07/01/2021, 8:22 AM

## 2021-07-02 ENCOUNTER — Encounter: Payer: Self-pay | Admitting: Ophthalmology

## 2021-08-27 ENCOUNTER — Ambulatory Visit: Payer: Medicare Other | Admitting: Dermatology

## 2021-09-11 ENCOUNTER — Ambulatory Visit: Payer: Medicare Other | Admitting: Dermatology

## 2022-09-03 ENCOUNTER — Ambulatory Visit: Payer: Medicare Other | Admitting: Dermatology

## 2022-09-03 VITALS — BP 142/69 | HR 55

## 2022-09-03 DIAGNOSIS — L570 Actinic keratosis: Secondary | ICD-10-CM | POA: Diagnosis not present

## 2022-09-03 DIAGNOSIS — C44311 Basal cell carcinoma of skin of nose: Secondary | ICD-10-CM | POA: Diagnosis not present

## 2022-09-03 DIAGNOSIS — D485 Neoplasm of uncertain behavior of skin: Secondary | ICD-10-CM

## 2022-09-03 DIAGNOSIS — L578 Other skin changes due to chronic exposure to nonionizing radiation: Secondary | ICD-10-CM

## 2022-09-03 DIAGNOSIS — L821 Other seborrheic keratosis: Secondary | ICD-10-CM | POA: Diagnosis not present

## 2022-09-03 NOTE — Progress Notes (Signed)
Follow-Up Visit   Subjective  Jerry Atkinson is a 89 y.o. male who presents for the following: Spots on the face, one on the right nose with history of bleeding. He has a history of BCC of the right ant alar crease treated with EDC. History of AKs.  The patient has spots, moles and lesions to be evaluated, some may be new or changing and the patient has concerns that these could be cancer.  Patient accompanied by son.    The following portions of the chart were reviewed this encounter and updated as appropriate: medications, allergies, medical history  Review of Systems:  No other skin or systemic complaints except as noted in HPI or Assessment and Plan.  Objective  Well appearing patient in no apparent distress; mood and affect are within normal limits.  A focused examination was performed of the following areas: face Relevant physical exam findings are noted in the Assessment and Plan.  R ant nasal ala 4.0 mm ulceration     L ear antihelix x 2, L cheek x 4, L temple x 9, L nasal tip x 1, L jaw x 1, R malar cheek x 4, R cheek x 4, R forehead x 3, R postauricular x 1, L neck x 3 (32) Pink scaly macules.    Assessment & Plan   Neoplasm of uncertain behavior of skin R ant nasal ala  Skin / nail biopsy Type of biopsy: tangential   Informed consent: discussed and consent obtained   Patient was prepped and draped in usual sterile fashion: Area prepped with alcohol. Anesthesia: the lesion was anesthetized in a standard fashion   Anesthetic:  1% lidocaine w/ epinephrine 1-100,000 buffered w/ 8.4% NaHCO3 Instrument used: flexible razor blade   Hemostasis achieved with: pressure, aluminum chloride and electrodesiccation   Outcome: patient tolerated procedure well   Post-procedure details: wound care instructions given   Post-procedure details comment:  Ointment and small bandage applied  Specimen 1 - Surgical pathology Differential Diagnosis: Recurrent BCC vs other Check  Margins: No [id number]  If + for recurrent BCC, discussed radiation vs Mohs with patient and son. Patient will talk to both of his sons and decide which treatment he would prefer.   Call patient's son, [name], with biopsy results [phone].  Hypertrophic actinic keratosis (32) L ear antihelix x 2, L cheek x 4, L temple x 9, L nasal tip x 1, L jaw x 1, R malar cheek x 4, R cheek x 4, R forehead x 3, R postauricular x 1, L neck x 3  Vs ISKs  Actinic keratoses are precancerous spots that appear secondary to cumulative UV radiation exposure/sun exposure over time. They are chronic with expected duration over 1 year. A portion of actinic keratoses will progress to squamous cell carcinoma of the skin. It is not possible to reliably predict which spots will progress to skin cancer and so treatment is recommended to prevent development of skin cancer.  Recommend daily broad spectrum sunscreen SPF 30+ to sun-exposed areas, reapply every 2 hours as needed.  Recommend staying in the shade or wearing long sleeves, sun glasses (UVA+UVB protection) and wide brim hats (4-inch brim around the entire circumference of the hat). Call for new or changing lesions.  Destruction of lesion - L ear antihelix x 2, L cheek x 4, L temple x 9, L nasal tip x 1, L jaw x 1, R malar cheek x 4, R cheek x 4, R forehead x 3, R postauricular x  1, L neck x 3  Destruction method: cryotherapy   Informed consent: discussed and consent obtained   Lesion destroyed using liquid nitrogen: Yes   Region frozen until ice ball extended beyond lesion: Yes   Outcome: patient tolerated procedure well with no complications   Post-procedure details: wound care instructions given   Additional details:  Prior to procedure, discussed risks of blister formation, small wound, skin dyspigmentation, or rare scar following cryotherapy. Recommend Vaseline ointment to treated areas while healing.   ACTINIC DAMAGE - chronic, secondary to  cumulative UV radiation exposure/sun exposure over time - diffuse scaly erythematous macules with underlying dyspigmentation - Recommend daily broad spectrum sunscreen SPF 30+ to sun-exposed areas, reapply every 2 hours as needed.  - Recommend staying in the shade or wearing long sleeves, sun glasses (UVA+UVB protection) and wide brim hats (4-inch brim around the entire circumference of the hat). - Call for new or changing lesions.  SEBORRHEIC KERATOSIS - Stuck-on, waxy, tan-brown papules and/or plaques  - Benign-appearing - Discussed benign etiology and prognosis. - Observe - Call for any changes   Return in about 3 months (around 12/03/2022) for AKs, Hx BCC.  ICherlyn Labella, CMA, am acting as scribe for Willeen Niece, MD .   Documentation: I have reviewed the above documentation for accuracy and completeness, and I agree with the above.  Willeen Niece, MD

## 2022-09-03 NOTE — Patient Instructions (Addendum)
Wound Care Instructions  Cleanse wound gently with soap and water once a day then pat dry with clean gauze. Apply a thin coat of Petrolatum (petroleum jelly, "Vaseline") over the wound (unless you have an allergy to this). We recommend that you use a new, sterile tube of Vaseline. Do not pick or remove scabs. Do not remove the yellow or white "healing tissue" from the base of the wound.  Cover the wound with fresh, clean, nonstick gauze and secure with paper tape. You may use Band-Aids in place of gauze and tape if the wound is small enough, but would recommend trimming much of the tape off as there is often too much. Sometimes Band-Aids can irritate the skin.  You should call the office for your biopsy report after 1 week if you have not already been contacted.  If you experience any problems, such as abnormal amounts of bleeding, swelling, significant bruising, significant pain, or evidence of infection, please call the office immediately.  FOR ADULT SURGERY PATIENTS: If you need something for pain relief you may take 1 extra strength Tylenol (acetaminophen) AND 2 Ibuprofen (200mg each) together every 4 hours as needed for pain. (do not take these if you are allergic to them or if you have a reason you should not take them.) Typically, you may only need pain medication for 1 to 3 days.     Due to recent changes in healthcare laws, you may see results of your pathology and/or laboratory studies on MyChart before the doctors have had a chance to review them. We understand that in some cases there may be results that are confusing or concerning to you. Please understand that not all results are received at the same time and often the doctors may need to interpret multiple results in order to provide you with the best plan of care or course of treatment. Therefore, we ask that you please give us 2 business days to thoroughly review all your results before contacting the office for clarification. Should  we see a critical lab result, you will be contacted sooner.   If You Need Anything After Your Visit  If you have any questions or concerns for your doctor, please call our main line at [phone] and press option 4 to reach your doctor's medical assistant. If no one answers, please leave a voicemail as directed and we will return your call as soon as possible. Messages left after 4 pm will be answered the following business day.   You may also send us a message via MyChart. We typically respond to MyChart messages within 1-2 business days.  For prescription refills, please ask your pharmacy to contact our office. Our fax number is [phone].  If you have an urgent issue when the clinic is closed that cannot wait until the next business day, you can page your doctor at the number below.    Please note that while we do our best to be available for urgent issues outside of office hours, we are not available 24/7.   If you have an urgent issue and are unable to reach us, you may choose to seek medical care at your doctor's office, retail clinic, urgent care center, or emergency room.  If you have a medical emergency, please immediately call 911 or go to the emergency department.  Pager Numbers  - Dr. Kowalski: [phone]  - Dr. Moye: [phone]  - Dr. Stewart: [phone]  In the event of inclement weather, please call our main line at   [phone] for an update on the status of any delays or closures.  Dermatology Medication Tips: Please keep the boxes that topical medications come in in order to help keep track of the instructions about where and how to use these. Pharmacies typically print the medication instructions only on the boxes and not directly on the medication tubes.   If your medication is too expensive, please contact our office at [phone] option 4 or send us a message through MyChart.   We are unable to tell what your co-pay for medications will be in  advance as this is different depending on your insurance coverage. However, we may be able to find a substitute medication at lower cost or fill out paperwork to get insurance to cover a needed medication.   If a prior authorization is required to get your medication covered by your insurance company, please allow us 1-2 business days to complete this process.  Drug prices often vary depending on where the prescription is filled and some pharmacies may offer cheaper prices.  The website www.goodrx.com contains coupons for medications through different pharmacies. The prices here do not account for what the cost may be with help from insurance (it may be cheaper with your insurance), but the website can give you the price if you did not use any insurance.  - You can print the associated coupon and take it with your prescription to the pharmacy.  - You may also stop by our office during regular business hours and pick up a GoodRx coupon card.  - If you need your prescription sent electronically to a different pharmacy, notify our office through [facility] MyChart or by phone at [phone] option 4.     Si Usted Necesita Algo Despus de Su Visita  Tambin puede enviarnos un mensaje a travs de MyChart. Por lo general respondemos a los mensajes de MyChart en el transcurso de 1 a 2 das hbiles.  Para renovar recetas, por favor pida a su farmacia que se ponga en contacto con nuestra oficina. Nuestro nmero de fax es el [phone].  Si tiene un asunto urgente cuando la clnica est cerrada y que no puede esperar hasta el siguiente da hbil, puede llamar/localizar a su doctor(a) al nmero que aparece a continuacin.   Por favor, tenga en cuenta que aunque hacemos todo lo posible para estar disponibles para asuntos urgentes fuera del horario de oficina, no estamos disponibles las 24 horas del da, los 7 das de la semana.   Si tiene un problema urgente y no puede comunicarse con nosotros, puede  optar por buscar atencin mdica  en el consultorio de su doctor(a), en una clnica privada, en un centro de atencin urgente o en una sala de emergencias.  Si tiene una emergencia mdica, por favor llame inmediatamente al 911 o vaya a la sala de emergencias.  Nmeros de bper  - Dr. Kowalski: [phone]  - Dra. Moye: [phone]  - Dra. Stewart: [phone]  En caso de inclemencias del tiempo, por favor llame a nuestra lnea principal al [phone] para una actualizacin sobre el estado de cualquier retraso o cierre.  Consejos para la medicacin en dermatologa: Por favor, guarde las cajas en las que vienen los medicamentos de uso tpico para ayudarle a seguir las instrucciones sobre dnde y cmo usarlos. Las farmacias generalmente imprimen las instrucciones del medicamento slo en las cajas y no directamente en los tubos del medicamento.   Si su medicamento es muy caro, por favor, pngase en contacto con   nuestra oficina llamando al [phone] y presione la opcin 4 o envenos un mensaje a travs de MyChart.   No podemos decirle cul ser su copago por los medicamentos por adelantado ya que esto es diferente dependiendo de la cobertura de su seguro. Sin embargo, es posible que podamos encontrar un medicamento sustituto a menor costo o llenar un formulario para que el seguro cubra el medicamento que se considera necesario.   Si se requiere una autorizacin previa para que su compaa de seguros cubra su medicamento, por favor permtanos de 1 a 2 das hbiles para completar este proceso.  Los precios de los medicamentos varan con frecuencia dependiendo del lugar de dnde se surte la receta y alguna farmacias pueden ofrecer precios ms baratos.  El sitio web www.goodrx.com tiene cupones para medicamentos de diferentes farmacias. Los precios aqu no tienen en cuenta lo que podra costar con la ayuda del seguro (puede ser ms barato con su seguro), pero el sitio web puede darle el  precio si no utiliz ningn seguro.  - Puede imprimir el cupn correspondiente y llevarlo con su receta a la farmacia.  - Tambin puede pasar por nuestra oficina durante el horario de atencin regular y recoger una tarjeta de cupones de GoodRx.  - Si necesita que su receta se enve electrnicamente a una farmacia diferente, informe a nuestra oficina a travs de MyChart de [facility] o por telfono llamando al [phone] y presione la opcin 4.  

## 2022-09-08 ENCOUNTER — Telehealth: Payer: Self-pay

## 2022-09-08 DIAGNOSIS — C44311 Basal cell carcinoma of skin of nose: Secondary | ICD-10-CM

## 2022-09-08 NOTE — Telephone Encounter (Signed)
-----   Message from Willeen Niece, MD sent at 09/08/2022  1:28 PM EDT ----- Skin , right ant nasal ala BASAL CELL CARCINOMA, NODULAR AND INFILTRATIVE PATTERNS  Recurrent BCC skin cancer, needs additional treatment.  Would recommend radiation at [facility] with Dr. Rushie Chestnut.  Could also consider Mohs surgery, but would likely require nasal reconstruction.   - please call patient

## 2022-09-08 NOTE — Telephone Encounter (Signed)
Discussed bx results with patient's son.  Discussed treatment options and patients son agrees with radiation treatment.  I advised I would send referral to Dr. Kipp Laurence office and they would reach out to patients son to schedule.

## 2022-09-18 ENCOUNTER — Encounter: Payer: Self-pay | Admitting: Radiation Oncology

## 2022-09-18 ENCOUNTER — Ambulatory Visit
Admission: RE | Admit: 2022-09-18 | Discharge: 2022-09-18 | Disposition: A | Discharge status: Home / Self Care | Payer: Medicare Other | Source: Ambulatory Visit | Attending: Radiation Oncology | Admitting: Radiation Oncology

## 2022-09-18 VITALS — BP 143/71 | HR 51 | Temp 97.6°F | Resp 20 | Wt 120.6 lb

## 2022-09-18 DIAGNOSIS — C44311 Basal cell carcinoma of skin of nose: Secondary | ICD-10-CM | POA: Insufficient documentation

## 2022-09-18 DIAGNOSIS — Z510 Encounter for antineoplastic radiation therapy: Secondary | ICD-10-CM | POA: Insufficient documentation

## 2022-09-18 NOTE — Consult Note (Signed)
NEW PATIENT EVALUATION  Name: Jerry Atkinson  MRN: [id number]  Date:   09/18/2022     DOB: 08-17-1930   This 89 y.o. male patient presents to the clinic for initial evaluation of basal cell carcinoma of the nose located in the right anterior nasal alla.  REFERRING PHYSICIAN: [facility]  CHIEF COMPLAINT:  Chief Complaint  Patient presents with   Skin Cancer    Consult    DIAGNOSIS: The encounter diagnosis was Basal cell carcinoma (BCC) of skin of nose.   PREVIOUS INVESTIGATIONS:  Pathology report reviewed Clinical notes reviewed  HPI: Patient is an 89 year old male recently had a biopsy of a lesion of the right anterior nasal alla which was positive for basal cell carcinoma.  According to dermatologist notes this is a recurrence.  Tumor was a basal cell carcinoma with nodular and infiltrative pattern.  Patient was offered Mohs surgery versus radiation and is now referred to radiation collagen for opinion he is asymptomatic.  He has significant solar damage to his skin on his face and visible areas of skin.  PLANNED TREATMENT REGIMEN: Electron-beam therapy  PAST MEDICAL HISTORY:  has a past medical history of Actinic keratosis, Basal cell carcinoma (04/09/2020), BPH (benign prostatic hypertrophy), Diabetes mellitus, type 2 (HCC), GERD (gastroesophageal reflux disease), Hypercholesteremia, Hypertension, Wears dentures, and Wears hearing aid in both ears.    PAST SURGICAL HISTORY:  Past Surgical History:  Procedure Laterality Date   CATARACT EXTRACTION W/PHACO Right 09/10/2015   Procedure: CATARACT EXTRACTION PHACO AND INTRAOCULAR LENS PLACEMENT ([facility]);  Surgeon: Sherald Hess, MD;  Location: [facility];  Service: Ophthalmology;  Laterality: Right;  LEAVE MARTINS TOGETHER   CATARACT EXTRACTION W/PHACO Left 07/01/2021   Procedure: CATARACT EXTRACTION PHACO AND INTRAOCULAR LENS PLACEMENT ([facility]) LEFT DIABETIC;  Surgeon: Nevada Crane, MD;  Location: [facility];  Service: Ophthalmology;  Laterality: Left;  Diabetic 7.49 00:53.9   REPLACEMENT TOTAL KNEE      FAMILY HISTORY: family history is not on file.  SOCIAL HISTORY:  reports that he has never smoked. He has never used smokeless tobacco. He reports that he does not drink alcohol.  ALLERGIES: Patient has no known allergies.  MEDICATIONS:  Current Outpatient Medications  Medication Sig Dispense Refill   acetaminophen (TYLENOL) 325 MG tablet Take by mouth.     aspirin 81 MG tablet Take 81 mg by mouth daily.     Blood Glucose Calibration (ACCU-CHEK GUIDE CONTROL) LIQD      cyanocobalamin 1000 MCG tablet Take 1,000 mcg by mouth daily.     ELIQUIS 5 MG TABS tablet      finasteride (PROSCAR) 5 MG tablet Take 5 mg by mouth daily.     glimepiride (AMARYL) 1 MG tablet Take 1 mg by mouth daily.     ramipril (ALTACE) 10 MG capsule Take by mouth.     simvastatin (ZOCOR) 20 MG tablet Take 20 mg by mouth daily.     tamsulosin (FLOMAX) 0.4 MG CAPS capsule Take 0.4 mg by mouth daily.     No current facility-administered medications for this encounter.    ECOG PERFORMANCE STATUS:  0 - Asymptomatic  REVIEW OF SYSTEMS: Patient denies any weight loss, fatigue, weakness, fever, chills or night sweats. Patient denies any loss of vision, blurred vision. Patient denies any ringing  of the ears or hearing loss. No irregular heartbeat. Patient denies heart murmur or history of fainting. Patient denies any chest pain or pain radiating to her upper extremities.  Patient denies any shortness of breath, difficulty breathing at night, cough or hemoptysis. Patient denies any swelling in the lower legs. Patient denies any nausea vomiting, vomiting of blood, or coffee ground material in the vomitus. Patient denies any stomach pain. Patient states has had normal bowel movements no significant constipation or diarrhea. Patient denies any dysuria, hematuria or significant nocturia. Patient denies any problems  walking, swelling in the joints or loss of balance. Patient denies any skin changes, loss of hair or loss of weight. Patient denies any excessive worrying or anxiety or significant depression. Patient denies any problems with insomnia. Patient denies excessive thirst, polyuria, polydipsia. Patient denies any swollen glands, patient denies easy bruising or easy bleeding. Patient denies any recent infections, allergies or URI. Patient "s visual fields have not changed significantly in recent time.   PHYSICAL EXAM: BP (!) 143/71   Pulse (!) 51   Temp 97.6 F (36.4 C)   Resp 20   Wt 120 lb 9.6 oz (54.7 kg)   SpO2 100%   BMI 20.07 kg/m  Small erythematous area of the right anterior nasal alla.  No evidence of submental cervical or supraclavicular adenopathy.  Well-developed well-nourished patient in NAD. HEENT reveals PERLA, EOMI, discs not visualized.  Oral cavity is clear. No oral mucosal lesions are identified. Neck is clear without evidence of cervical or supraclavicular adenopathy. Lungs are clear to A&P. Cardiac examination is essentially unremarkable with regular rate and rhythm without murmur rub or thrill. Abdomen is benign with no organomegaly or masses noted. Motor sensory and DTR levels are equal and symmetric in the upper and lower extremities. Cranial nerves II through XII are grossly intact. Proprioception is intact. No peripheral adenopathy or edema is identified. No motor or sensory levels are noted. Crude visual fields are within normal range.  LABORATORY DATA: Pathology report reviewed    RADIOLOGY RESULTS: No current films for review   IMPRESSION: Nodular and infiltrative basal cell carcinoma of the right nasal alla in 89 year old male  PLAN: At this time I have recommended electron-beam therapy to his nose.  Will plan on delivering 50 Gray over 5 weeks.  Risks and benefits of treatment including slight erythematous changes of the skin some fatigue all were described in detail to  the patient and his son.  I have personally set up and ordered CT simulation for next week.  They both seem to comprehend my plan well.  I would like to take this opportunity to thank you for allowing me to participate in the care of your patient.Carmina Miller, MD

## 2022-09-24 ENCOUNTER — Ambulatory Visit
Admission: RE | Admit: 2022-09-24 | Discharge: 2022-09-24 | Disposition: A | Discharge status: Home / Self Care | Payer: Medicare Other | Source: Ambulatory Visit | Attending: Radiation Oncology | Admitting: Radiation Oncology

## 2022-09-24 DIAGNOSIS — Z510 Encounter for antineoplastic radiation therapy: Secondary | ICD-10-CM | POA: Diagnosis present

## 2022-09-24 DIAGNOSIS — C44311 Basal cell carcinoma of skin of nose: Secondary | ICD-10-CM | POA: Diagnosis not present

## 2022-09-30 DIAGNOSIS — Z510 Encounter for antineoplastic radiation therapy: Secondary | ICD-10-CM | POA: Diagnosis not present

## 2022-10-01 ENCOUNTER — Other Ambulatory Visit: Payer: Self-pay

## 2022-10-01 ENCOUNTER — Ambulatory Visit
Admission: RE | Admit: 2022-10-01 | Discharge: 2022-10-01 | Disposition: A | Discharge status: Home / Self Care | Payer: Medicare Other | Source: Ambulatory Visit | Attending: Radiation Oncology | Admitting: Radiation Oncology

## 2022-10-01 DIAGNOSIS — Z510 Encounter for antineoplastic radiation therapy: Secondary | ICD-10-CM | POA: Diagnosis not present

## 2022-10-01 LAB — RAD ONC ARIA SESSION SUMMARY
Course Elapsed Days: 0
Plan Fractions Treated to Date: 1
Plan Prescribed Dose Per Fraction: 2 Gy
Plan Total Fractions Prescribed: 25
Plan Total Prescribed Dose: 50 Gy
Reference Point Dosage Given to Date: 2 Gy
Reference Point Session Dosage Given: 2 Gy
Session Number: 1

## 2022-10-02 ENCOUNTER — Other Ambulatory Visit: Payer: Self-pay

## 2022-10-02 ENCOUNTER — Ambulatory Visit
Admission: RE | Admit: 2022-10-02 | Discharge: 2022-10-02 | Disposition: A | Discharge status: Home / Self Care | Payer: Medicare Other | Source: Ambulatory Visit | Attending: Radiation Oncology | Admitting: Radiation Oncology

## 2022-10-02 DIAGNOSIS — Z510 Encounter for antineoplastic radiation therapy: Secondary | ICD-10-CM | POA: Diagnosis not present

## 2022-10-02 LAB — RAD ONC ARIA SESSION SUMMARY
Course Elapsed Days: 1
Plan Fractions Treated to Date: 2
Plan Prescribed Dose Per Fraction: 2 Gy
Plan Total Fractions Prescribed: 25
Plan Total Prescribed Dose: 50 Gy
Reference Point Dosage Given to Date: 4 Gy
Reference Point Session Dosage Given: 2 Gy
Session Number: 2

## 2022-10-03 ENCOUNTER — Other Ambulatory Visit: Payer: Self-pay

## 2022-10-03 ENCOUNTER — Ambulatory Visit
Admission: RE | Admit: 2022-10-03 | Discharge: 2022-10-03 | Disposition: A | Discharge status: Home / Self Care | Payer: Medicare Other | Source: Ambulatory Visit | Attending: Radiation Oncology | Admitting: Radiation Oncology

## 2022-10-03 DIAGNOSIS — Z510 Encounter for antineoplastic radiation therapy: Secondary | ICD-10-CM | POA: Diagnosis not present

## 2022-10-03 LAB — RAD ONC ARIA SESSION SUMMARY
Course Elapsed Days: 2
Plan Fractions Treated to Date: 3
Plan Prescribed Dose Per Fraction: 2 Gy
Plan Total Fractions Prescribed: 25
Plan Total Prescribed Dose: 50 Gy
Reference Point Dosage Given to Date: 6 Gy
Reference Point Session Dosage Given: 2 Gy
Session Number: 3

## 2022-10-07 ENCOUNTER — Other Ambulatory Visit: Payer: Self-pay

## 2022-10-07 ENCOUNTER — Ambulatory Visit
Admission: RE | Admit: 2022-10-07 | Discharge: 2022-10-07 | Disposition: A | Discharge status: Home / Self Care | Payer: Medicare Other | Source: Ambulatory Visit | Attending: Radiation Oncology | Admitting: Radiation Oncology

## 2022-10-07 DIAGNOSIS — Z510 Encounter for antineoplastic radiation therapy: Secondary | ICD-10-CM | POA: Diagnosis not present

## 2022-10-07 LAB — RAD ONC ARIA SESSION SUMMARY
Course Elapsed Days: 6
Plan Fractions Treated to Date: 4
Plan Prescribed Dose Per Fraction: 2 Gy
Plan Total Fractions Prescribed: 25
Plan Total Prescribed Dose: 50 Gy
Reference Point Dosage Given to Date: 8 Gy
Reference Point Session Dosage Given: 2 Gy
Session Number: 4

## 2022-10-08 ENCOUNTER — Ambulatory Visit
Admission: RE | Admit: 2022-10-08 | Discharge: 2022-10-08 | Disposition: A | Discharge status: Home / Self Care | Payer: Medicare Other | Source: Ambulatory Visit | Attending: Radiation Oncology | Admitting: Radiation Oncology

## 2022-10-08 ENCOUNTER — Other Ambulatory Visit: Payer: Self-pay

## 2022-10-08 DIAGNOSIS — Z510 Encounter for antineoplastic radiation therapy: Secondary | ICD-10-CM | POA: Diagnosis not present

## 2022-10-08 LAB — RAD ONC ARIA SESSION SUMMARY
Course Elapsed Days: 7
Plan Fractions Treated to Date: 5
Plan Prescribed Dose Per Fraction: 2 Gy
Plan Total Fractions Prescribed: 25
Plan Total Prescribed Dose: 50 Gy
Reference Point Dosage Given to Date: 10 Gy
Reference Point Session Dosage Given: 2 Gy
Session Number: 5

## 2022-10-09 ENCOUNTER — Other Ambulatory Visit: Payer: Self-pay

## 2022-10-09 ENCOUNTER — Ambulatory Visit
Admission: RE | Admit: 2022-10-09 | Discharge: 2022-10-09 | Disposition: A | Discharge status: Home / Self Care | Payer: Medicare Other | Source: Ambulatory Visit | Attending: Radiation Oncology | Admitting: Radiation Oncology

## 2022-10-09 DIAGNOSIS — Z510 Encounter for antineoplastic radiation therapy: Secondary | ICD-10-CM | POA: Diagnosis not present

## 2022-10-09 LAB — RAD ONC ARIA SESSION SUMMARY
Course Elapsed Days: 8
Plan Fractions Treated to Date: 6
Plan Prescribed Dose Per Fraction: 2 Gy
Plan Total Fractions Prescribed: 25
Plan Total Prescribed Dose: 50 Gy
Reference Point Dosage Given to Date: 12 Gy
Reference Point Session Dosage Given: 2 Gy
Session Number: 6

## 2022-10-10 ENCOUNTER — Other Ambulatory Visit: Payer: Self-pay

## 2022-10-10 ENCOUNTER — Ambulatory Visit
Admission: RE | Admit: 2022-10-10 | Discharge: 2022-10-10 | Disposition: A | Discharge status: Home / Self Care | Payer: Medicare Other | Source: Ambulatory Visit | Attending: Radiation Oncology | Admitting: Radiation Oncology

## 2022-10-10 DIAGNOSIS — Z510 Encounter for antineoplastic radiation therapy: Secondary | ICD-10-CM | POA: Diagnosis not present

## 2022-10-10 LAB — RAD ONC ARIA SESSION SUMMARY
Course Elapsed Days: 9
Plan Fractions Treated to Date: 7
Plan Prescribed Dose Per Fraction: 2 Gy
Plan Total Fractions Prescribed: 25
Plan Total Prescribed Dose: 50 Gy
Reference Point Dosage Given to Date: 14 Gy
Reference Point Session Dosage Given: 2 Gy
Session Number: 7

## 2022-10-13 ENCOUNTER — Ambulatory Visit
Admission: RE | Admit: 2022-10-13 | Discharge: 2022-10-13 | Disposition: A | Discharge status: Home / Self Care | Payer: Medicare Other | Source: Ambulatory Visit | Attending: Radiation Oncology | Admitting: Radiation Oncology

## 2022-10-13 ENCOUNTER — Other Ambulatory Visit: Payer: Self-pay

## 2022-10-13 DIAGNOSIS — C44311 Basal cell carcinoma of skin of nose: Secondary | ICD-10-CM | POA: Insufficient documentation

## 2022-10-13 DIAGNOSIS — Z510 Encounter for antineoplastic radiation therapy: Secondary | ICD-10-CM | POA: Insufficient documentation

## 2022-10-13 LAB — RAD ONC ARIA SESSION SUMMARY
Course Elapsed Days: 12
Plan Fractions Treated to Date: 8
Plan Prescribed Dose Per Fraction: 2 Gy
Plan Total Fractions Prescribed: 25
Plan Total Prescribed Dose: 50 Gy
Reference Point Dosage Given to Date: 16 Gy
Reference Point Session Dosage Given: 2 Gy
Session Number: 8

## 2022-10-14 ENCOUNTER — Other Ambulatory Visit: Payer: Self-pay

## 2022-10-14 ENCOUNTER — Ambulatory Visit
Admission: RE | Admit: 2022-10-14 | Discharge: 2022-10-14 | Disposition: A | Discharge status: Home / Self Care | Payer: Medicare Other | Source: Ambulatory Visit | Attending: Radiation Oncology | Admitting: Radiation Oncology

## 2022-10-14 DIAGNOSIS — Z510 Encounter for antineoplastic radiation therapy: Secondary | ICD-10-CM | POA: Diagnosis not present

## 2022-10-14 LAB — RAD ONC ARIA SESSION SUMMARY
Course Elapsed Days: 13
Plan Fractions Treated to Date: 9
Plan Prescribed Dose Per Fraction: 2 Gy
Plan Total Fractions Prescribed: 25
Plan Total Prescribed Dose: 50 Gy
Reference Point Dosage Given to Date: 18 Gy
Reference Point Session Dosage Given: 2 Gy
Session Number: 9

## 2022-10-15 ENCOUNTER — Ambulatory Visit
Admission: RE | Admit: 2022-10-15 | Discharge: 2022-10-15 | Disposition: A | Discharge status: Home / Self Care | Payer: Medicare Other | Source: Ambulatory Visit | Attending: Radiation Oncology | Admitting: Radiation Oncology

## 2022-10-15 ENCOUNTER — Other Ambulatory Visit: Payer: Self-pay

## 2022-10-15 DIAGNOSIS — Z510 Encounter for antineoplastic radiation therapy: Secondary | ICD-10-CM | POA: Diagnosis not present

## 2022-10-15 LAB — RAD ONC ARIA SESSION SUMMARY
Course Elapsed Days: 14
Plan Fractions Treated to Date: 10
Plan Prescribed Dose Per Fraction: 2 Gy
Plan Total Fractions Prescribed: 25
Plan Total Prescribed Dose: 50 Gy
Reference Point Dosage Given to Date: 20 Gy
Reference Point Session Dosage Given: 2 Gy
Session Number: 10

## 2022-10-16 ENCOUNTER — Other Ambulatory Visit: Payer: Self-pay

## 2022-10-16 ENCOUNTER — Ambulatory Visit
Admission: RE | Admit: 2022-10-16 | Discharge: 2022-10-16 | Disposition: A | Discharge status: Home / Self Care | Payer: Medicare Other | Source: Ambulatory Visit | Attending: Radiation Oncology | Admitting: Radiation Oncology

## 2022-10-16 DIAGNOSIS — Z510 Encounter for antineoplastic radiation therapy: Secondary | ICD-10-CM | POA: Diagnosis not present

## 2022-10-16 LAB — RAD ONC ARIA SESSION SUMMARY
Course Elapsed Days: 15
Plan Fractions Treated to Date: 11
Plan Prescribed Dose Per Fraction: 2 Gy
Plan Total Fractions Prescribed: 25
Plan Total Prescribed Dose: 50 Gy
Reference Point Dosage Given to Date: 22 Gy
Reference Point Session Dosage Given: 2 Gy
Session Number: 11

## 2022-10-17 ENCOUNTER — Other Ambulatory Visit: Payer: Self-pay

## 2022-10-17 ENCOUNTER — Ambulatory Visit
Admission: RE | Admit: 2022-10-17 | Discharge: 2022-10-17 | Disposition: A | Discharge status: Home / Self Care | Payer: Medicare Other | Source: Ambulatory Visit | Attending: Radiation Oncology | Admitting: Radiation Oncology

## 2022-10-17 DIAGNOSIS — Z510 Encounter for antineoplastic radiation therapy: Secondary | ICD-10-CM | POA: Diagnosis not present

## 2022-10-17 LAB — RAD ONC ARIA SESSION SUMMARY
Course Elapsed Days: 16
Plan Fractions Treated to Date: 12
Plan Prescribed Dose Per Fraction: 2 Gy
Plan Total Fractions Prescribed: 25
Plan Total Prescribed Dose: 50 Gy
Reference Point Dosage Given to Date: 24 Gy
Reference Point Session Dosage Given: 2 Gy
Session Number: 12

## 2022-10-20 ENCOUNTER — Ambulatory Visit
Admission: RE | Admit: 2022-10-20 | Discharge: 2022-10-20 | Disposition: A | Discharge status: Home / Self Care | Payer: Medicare Other | Source: Ambulatory Visit | Attending: Radiation Oncology | Admitting: Radiation Oncology

## 2022-10-20 ENCOUNTER — Other Ambulatory Visit: Payer: Self-pay

## 2022-10-20 DIAGNOSIS — Z510 Encounter for antineoplastic radiation therapy: Secondary | ICD-10-CM | POA: Diagnosis not present

## 2022-10-20 LAB — RAD ONC ARIA SESSION SUMMARY
Course Elapsed Days: 19
Plan Fractions Treated to Date: 13
Plan Prescribed Dose Per Fraction: 2 Gy
Plan Total Fractions Prescribed: 25
Plan Total Prescribed Dose: 50 Gy
Reference Point Dosage Given to Date: 26 Gy
Reference Point Session Dosage Given: 2 Gy
Session Number: 13

## 2022-10-21 ENCOUNTER — Ambulatory Visit
Admission: RE | Admit: 2022-10-21 | Discharge: 2022-10-21 | Disposition: A | Discharge status: Home / Self Care | Payer: Medicare Other | Source: Ambulatory Visit | Attending: Radiation Oncology | Admitting: Radiation Oncology

## 2022-10-21 ENCOUNTER — Other Ambulatory Visit: Payer: Self-pay

## 2022-10-21 DIAGNOSIS — Z510 Encounter for antineoplastic radiation therapy: Secondary | ICD-10-CM | POA: Diagnosis not present

## 2022-10-21 LAB — RAD ONC ARIA SESSION SUMMARY
Course Elapsed Days: 20
Plan Fractions Treated to Date: 14
Plan Prescribed Dose Per Fraction: 2 Gy
Plan Total Fractions Prescribed: 25
Plan Total Prescribed Dose: 50 Gy
Reference Point Dosage Given to Date: 28 Gy
Reference Point Session Dosage Given: 2 Gy
Session Number: 14

## 2022-10-22 ENCOUNTER — Other Ambulatory Visit: Payer: Self-pay

## 2022-10-22 ENCOUNTER — Ambulatory Visit
Admission: RE | Admit: 2022-10-22 | Discharge: 2022-10-22 | Disposition: A | Discharge status: Home / Self Care | Payer: Medicare Other | Source: Ambulatory Visit | Attending: Radiation Oncology | Admitting: Radiation Oncology

## 2022-10-22 DIAGNOSIS — Z510 Encounter for antineoplastic radiation therapy: Secondary | ICD-10-CM | POA: Diagnosis not present

## 2022-10-22 LAB — RAD ONC ARIA SESSION SUMMARY
Course Elapsed Days: 21
Plan Fractions Treated to Date: 15
Plan Prescribed Dose Per Fraction: 2 Gy
Plan Total Fractions Prescribed: 25
Plan Total Prescribed Dose: 50 Gy
Reference Point Dosage Given to Date: 30 Gy
Reference Point Session Dosage Given: 2 Gy
Session Number: 15

## 2022-10-23 ENCOUNTER — Other Ambulatory Visit: Payer: Self-pay

## 2022-10-23 ENCOUNTER — Ambulatory Visit
Admission: RE | Admit: 2022-10-23 | Discharge: 2022-10-23 | Disposition: A | Discharge status: Home / Self Care | Payer: Medicare Other | Source: Ambulatory Visit | Attending: Radiation Oncology | Admitting: Radiation Oncology

## 2022-10-23 DIAGNOSIS — Z510 Encounter for antineoplastic radiation therapy: Secondary | ICD-10-CM | POA: Diagnosis not present

## 2022-10-23 LAB — RAD ONC ARIA SESSION SUMMARY
Course Elapsed Days: 22
Plan Fractions Treated to Date: 16
Plan Prescribed Dose Per Fraction: 2 Gy
Plan Total Fractions Prescribed: 25
Plan Total Prescribed Dose: 50 Gy
Reference Point Dosage Given to Date: 32 Gy
Reference Point Session Dosage Given: 2 Gy
Session Number: 16

## 2022-10-24 ENCOUNTER — Other Ambulatory Visit: Payer: Self-pay

## 2022-10-24 ENCOUNTER — Ambulatory Visit
Admission: RE | Admit: 2022-10-24 | Discharge: 2022-10-24 | Disposition: A | Discharge status: Home / Self Care | Payer: Medicare Other | Source: Ambulatory Visit | Attending: Radiation Oncology | Admitting: Radiation Oncology

## 2022-10-24 DIAGNOSIS — Z510 Encounter for antineoplastic radiation therapy: Secondary | ICD-10-CM | POA: Diagnosis not present

## 2022-10-24 LAB — RAD ONC ARIA SESSION SUMMARY
Course Elapsed Days: 23
Plan Fractions Treated to Date: 17
Plan Prescribed Dose Per Fraction: 2 Gy
Plan Total Fractions Prescribed: 25
Plan Total Prescribed Dose: 50 Gy
Reference Point Dosage Given to Date: 34 Gy
Reference Point Session Dosage Given: 2 Gy
Session Number: 17

## 2022-10-27 ENCOUNTER — Ambulatory Visit
Admission: RE | Admit: 2022-10-27 | Discharge: 2022-10-27 | Disposition: A | Discharge status: Home / Self Care | Payer: Medicare Other | Source: Ambulatory Visit | Attending: Radiation Oncology | Admitting: Radiation Oncology

## 2022-10-27 ENCOUNTER — Other Ambulatory Visit: Payer: Self-pay

## 2022-10-27 DIAGNOSIS — Z510 Encounter for antineoplastic radiation therapy: Secondary | ICD-10-CM | POA: Diagnosis not present

## 2022-10-27 LAB — RAD ONC ARIA SESSION SUMMARY
Course Elapsed Days: 26
Plan Fractions Treated to Date: 18
Plan Prescribed Dose Per Fraction: 2 Gy
Plan Total Fractions Prescribed: 25
Plan Total Prescribed Dose: 50 Gy
Reference Point Dosage Given to Date: 36 Gy
Reference Point Session Dosage Given: 2 Gy
Session Number: 18

## 2022-10-28 ENCOUNTER — Ambulatory Visit
Admission: RE | Admit: 2022-10-28 | Discharge: 2022-10-28 | Disposition: A | Discharge status: Home / Self Care | Payer: Medicare Other | Source: Ambulatory Visit | Attending: Radiation Oncology | Admitting: Radiation Oncology

## 2022-10-28 ENCOUNTER — Other Ambulatory Visit: Payer: Self-pay

## 2022-10-28 DIAGNOSIS — Z510 Encounter for antineoplastic radiation therapy: Secondary | ICD-10-CM | POA: Diagnosis not present

## 2022-10-28 LAB — RAD ONC ARIA SESSION SUMMARY
Course Elapsed Days: 27
Plan Fractions Treated to Date: 19
Plan Prescribed Dose Per Fraction: 2 Gy
Plan Total Fractions Prescribed: 25
Plan Total Prescribed Dose: 50 Gy
Reference Point Dosage Given to Date: 38 Gy
Reference Point Session Dosage Given: 2 Gy
Session Number: 19

## 2022-10-29 ENCOUNTER — Ambulatory Visit
Admission: RE | Admit: 2022-10-29 | Discharge: 2022-10-29 | Disposition: A | Discharge status: Home / Self Care | Payer: Medicare Other | Source: Ambulatory Visit | Attending: Radiation Oncology | Admitting: Radiation Oncology

## 2022-10-29 ENCOUNTER — Other Ambulatory Visit: Payer: Self-pay

## 2022-10-29 DIAGNOSIS — Z510 Encounter for antineoplastic radiation therapy: Secondary | ICD-10-CM | POA: Diagnosis not present

## 2022-10-29 LAB — RAD ONC ARIA SESSION SUMMARY
Course Elapsed Days: 28
Plan Fractions Treated to Date: 20
Plan Prescribed Dose Per Fraction: 2 Gy
Plan Total Fractions Prescribed: 25
Plan Total Prescribed Dose: 50 Gy
Reference Point Dosage Given to Date: 40 Gy
Reference Point Session Dosage Given: 2 Gy
Session Number: 20

## 2022-10-30 ENCOUNTER — Other Ambulatory Visit: Payer: Self-pay

## 2022-10-30 ENCOUNTER — Ambulatory Visit
Admission: RE | Admit: 2022-10-30 | Discharge: 2022-10-30 | Disposition: A | Discharge status: Home / Self Care | Payer: Medicare Other | Source: Ambulatory Visit | Attending: Radiation Oncology | Admitting: Radiation Oncology

## 2022-10-30 DIAGNOSIS — Z510 Encounter for antineoplastic radiation therapy: Secondary | ICD-10-CM | POA: Diagnosis not present

## 2022-10-30 LAB — RAD ONC ARIA SESSION SUMMARY
Course Elapsed Days: 29
Plan Fractions Treated to Date: 21
Plan Prescribed Dose Per Fraction: 2 Gy
Plan Total Fractions Prescribed: 25
Plan Total Prescribed Dose: 50 Gy
Reference Point Dosage Given to Date: 42 Gy
Reference Point Session Dosage Given: 2 Gy
Session Number: 21

## 2022-10-31 ENCOUNTER — Other Ambulatory Visit: Payer: Self-pay

## 2022-10-31 ENCOUNTER — Ambulatory Visit
Admission: RE | Admit: 2022-10-31 | Discharge: 2022-10-31 | Disposition: A | Discharge status: Home / Self Care | Payer: Medicare Other | Source: Ambulatory Visit | Attending: Radiation Oncology | Admitting: Radiation Oncology

## 2022-10-31 DIAGNOSIS — Z510 Encounter for antineoplastic radiation therapy: Secondary | ICD-10-CM | POA: Diagnosis not present

## 2022-10-31 LAB — RAD ONC ARIA SESSION SUMMARY
Course Elapsed Days: 30
Plan Fractions Treated to Date: 22
Plan Prescribed Dose Per Fraction: 2 Gy
Plan Total Fractions Prescribed: 25
Plan Total Prescribed Dose: 50 Gy
Reference Point Dosage Given to Date: 44 Gy
Reference Point Session Dosage Given: 2 Gy
Session Number: 22

## 2022-11-03 ENCOUNTER — Ambulatory Visit: Admission: RE | Admit: 2022-11-03 | Payer: Medicare Other | Source: Ambulatory Visit

## 2022-11-04 ENCOUNTER — Ambulatory Visit: Payer: Medicare Other

## 2022-11-05 ENCOUNTER — Ambulatory Visit: Payer: Medicare Other

## 2022-11-06 ENCOUNTER — Telehealth: Payer: Self-pay | Admitting: *Deleted

## 2022-11-06 NOTE — Telephone Encounter (Signed)
Son of the patient called today at about 4 because he is having so much pain ; he actually told his son that it was throbbing in his nose .  Son did say that they stopped some of the radiation because of this issue . they wanted to see if there is anything that they can do to help with the patient.  I told him that I am going to route this to Dr. Malachi Carl team to see what they can do.  Nodes it would have to be tomorrow because they have already left.

## 2022-11-07 ENCOUNTER — Telehealth: Payer: Self-pay | Admitting: *Deleted

## 2022-11-07 NOTE — Telephone Encounter (Signed)
Spoke with patient's son, instructed him to have patient continue to use the aquaphor on areas inside the nose and to take tylenol for the pain.  Son Ove Amat) verbalized understanding.

## 2022-12-10 ENCOUNTER — Ambulatory Visit: Payer: Medicare Other | Attending: Radiation Oncology | Admitting: Radiation Oncology

## 2022-12-10 DIAGNOSIS — Z510 Encounter for antineoplastic radiation therapy: Secondary | ICD-10-CM | POA: Insufficient documentation

## 2022-12-10 DIAGNOSIS — C44311 Basal cell carcinoma of skin of nose: Secondary | ICD-10-CM | POA: Insufficient documentation

## 2023-01-14 ENCOUNTER — Ambulatory Visit
Admission: EM | Admit: 2023-01-14 | Discharge: 2023-01-14 | Payer: Medicare Other | Attending: Family Medicine | Admitting: Family Medicine

## 2023-01-14 ENCOUNTER — Other Ambulatory Visit: Payer: Self-pay

## 2023-01-14 ENCOUNTER — Inpatient Hospital Stay
Admission: EM | Admit: 2023-01-14 | Discharge: 2023-01-17 | DRG: 244 | Disposition: A | Discharge status: Home / Self Care | Payer: Medicare Other | Attending: Internal Medicine | Admitting: Internal Medicine

## 2023-01-14 ENCOUNTER — Emergency Department: Payer: Medicare Other

## 2023-01-14 DIAGNOSIS — Z974 Presence of external hearing-aid: Secondary | ICD-10-CM

## 2023-01-14 DIAGNOSIS — Z7984 Long term (current) use of oral hypoglycemic drugs: Secondary | ICD-10-CM

## 2023-01-14 DIAGNOSIS — R55 Syncope and collapse: Principal | ICD-10-CM | POA: Diagnosis present

## 2023-01-14 DIAGNOSIS — E7800 Pure hypercholesterolemia, unspecified: Secondary | ICD-10-CM | POA: Diagnosis present

## 2023-01-14 DIAGNOSIS — I10 Essential (primary) hypertension: Secondary | ICD-10-CM | POA: Diagnosis present

## 2023-01-14 DIAGNOSIS — Z7901 Long term (current) use of anticoagulants: Secondary | ICD-10-CM

## 2023-01-14 DIAGNOSIS — I447 Left bundle-branch block, unspecified: Secondary | ICD-10-CM | POA: Diagnosis present

## 2023-01-14 DIAGNOSIS — Z23 Encounter for immunization: Secondary | ICD-10-CM

## 2023-01-14 DIAGNOSIS — Z7982 Long term (current) use of aspirin: Secondary | ICD-10-CM

## 2023-01-14 DIAGNOSIS — R002 Palpitations: Secondary | ICD-10-CM | POA: Diagnosis not present

## 2023-01-14 DIAGNOSIS — N400 Enlarged prostate without lower urinary tract symptoms: Secondary | ICD-10-CM | POA: Diagnosis present

## 2023-01-14 DIAGNOSIS — R001 Bradycardia, unspecified: Principal | ICD-10-CM | POA: Diagnosis present

## 2023-01-14 DIAGNOSIS — Z923 Personal history of irradiation: Secondary | ICD-10-CM

## 2023-01-14 DIAGNOSIS — I2510 Atherosclerotic heart disease of native coronary artery without angina pectoris: Secondary | ICD-10-CM | POA: Diagnosis present

## 2023-01-14 DIAGNOSIS — Z5986 Financial insecurity: Secondary | ICD-10-CM

## 2023-01-14 DIAGNOSIS — Z96659 Presence of unspecified artificial knee joint: Secondary | ICD-10-CM | POA: Diagnosis present

## 2023-01-14 DIAGNOSIS — E119 Type 2 diabetes mellitus without complications: Secondary | ICD-10-CM | POA: Diagnosis present

## 2023-01-14 DIAGNOSIS — I480 Paroxysmal atrial fibrillation: Secondary | ICD-10-CM | POA: Diagnosis present

## 2023-01-14 DIAGNOSIS — Z79899 Other long term (current) drug therapy: Secondary | ICD-10-CM

## 2023-01-14 DIAGNOSIS — Z85828 Personal history of other malignant neoplasm of skin: Secondary | ICD-10-CM

## 2023-01-14 LAB — CBC
HCT: 44.5 % (ref 39.0–52.0)
Hemoglobin: 14.7 g/dL (ref 13.0–17.0)
MCH: 32.5 pg (ref 26.0–34.0)
MCHC: 33 g/dL (ref 30.0–36.0)
MCV: 98.2 fL (ref 80.0–100.0)
Platelets: 142 10*3/uL — ABNORMAL LOW (ref 150–400)
RBC: 4.53 MIL/uL (ref 4.22–5.81)
RDW: 13.3 % (ref 11.5–15.5)
WBC: 4.5 10*3/uL (ref 4.0–10.5)
nRBC: 0 % (ref 0.0–0.2)

## 2023-01-14 LAB — HEMOGLOBIN A1C
Hgb A1c MFr Bld: 6.2 % — ABNORMAL HIGH (ref 4.8–5.6)
Mean Plasma Glucose: 131.24 mg/dL

## 2023-01-14 LAB — CBG MONITORING, ED
Glucose-Capillary: 92 mg/dL (ref 70–99)
Glucose-Capillary: 143 mg/dL — ABNORMAL HIGH (ref 70–99)

## 2023-01-14 LAB — BASIC METABOLIC PANEL
Anion gap: 7 (ref 5–15)
BUN: 28 mg/dL — ABNORMAL HIGH (ref 8–23)
CO2: 26 mmol/L (ref 22–32)
Calcium: 9 mg/dL (ref 8.9–10.3)
Chloride: 103 mmol/L (ref 98–111)
Creatinine, Ser: 0.82 mg/dL (ref 0.61–1.24)
GFR, Estimated: >60 mL/min (ref >60)
Glucose, Bld: 129 mg/dL — ABNORMAL HIGH (ref 70–99)
Potassium: 4.6 mmol/L (ref 3.5–5.1)
Sodium: 136 mmol/L (ref 135–145)

## 2023-01-14 LAB — MAGNESIUM: Magnesium: 2.6 mg/dL — ABNORMAL HIGH (ref 1.7–2.4)

## 2023-01-14 LAB — TROPONIN I (HIGH SENSITIVITY)
Troponin I (High Sensitivity): 10 ng/L (ref <18)
Troponin I (High Sensitivity): 10 ng/L (ref <18)

## 2023-01-14 LAB — TSH: TSH: 0.826 u[IU]/mL (ref 0.350–4.500)

## 2023-01-14 LAB — GLUCOSE, CAPILLARY: Glucose-Capillary: 190 mg/dL — ABNORMAL HIGH (ref 70–99)

## 2023-01-14 MED ORDER — RAMIPRIL 5 MG PO CAPS
10.0000 mg | ORAL_CAPSULE | Freq: Every day | ORAL | Status: DC
  Administered 2023-01-15 – 2023-01-17 (×3): 10 mg via ORAL
  Filled 2023-01-14: qty 1
  Filled 2023-01-14 (×3): qty 2

## 2023-01-14 MED ORDER — APIXABAN 2.5 MG PO TABS
2.5000 mg | ORAL_TABLET | Freq: Two times a day (BID) | ORAL | Status: DC
  Administered 2023-01-14: 2.5 mg via ORAL
  Filled 2023-01-14 (×2): qty 1

## 2023-01-14 MED ORDER — ACETAMINOPHEN 325 MG PO TABS
325.0000 mg | ORAL_TABLET | Freq: Four times a day (QID) | ORAL | Status: DC | PRN
  Administered 2023-01-14: 325 mg via ORAL
  Filled 2023-01-14: qty 1

## 2023-01-14 MED ORDER — SODIUM CHLORIDE 0.9% FLUSH
3.0000 mL | Freq: Two times a day (BID) | INTRAVENOUS | Status: DC
  Administered 2023-01-14 – 2023-01-17 (×4): 3 mL via INTRAVENOUS

## 2023-01-14 MED ORDER — GLIMEPIRIDE 1 MG PO TABS
1.0000 mg | ORAL_TABLET | Freq: Every day | ORAL | Status: DC
  Administered 2023-01-15 – 2023-01-17 (×3): 1 mg via ORAL
  Filled 2023-01-14 (×3): qty 1

## 2023-01-14 MED ORDER — FINASTERIDE 5 MG PO TABS
5.0000 mg | ORAL_TABLET | Freq: Every day | ORAL | Status: DC
  Administered 2023-01-15 – 2023-01-17 (×3): 5 mg via ORAL
  Filled 2023-01-14 (×3): qty 1

## 2023-01-14 MED ORDER — TAMSULOSIN HCL 0.4 MG PO CAPS
0.8000 mg | ORAL_CAPSULE | Freq: Every day | ORAL | Status: DC
  Administered 2023-01-15 – 2023-01-17 (×3): 0.8 mg via ORAL
  Filled 2023-01-14 (×3): qty 2

## 2023-01-14 MED ORDER — ASPIRIN 81 MG PO TBEC
81.0000 mg | DELAYED_RELEASE_TABLET | Freq: Every day | ORAL | Status: DC
  Administered 2023-01-15 – 2023-01-17 (×3): 81 mg via ORAL
  Filled 2023-01-14 (×3): qty 1

## 2023-01-14 MED ORDER — INSULIN ASPART 100 UNIT/ML IJ SOLN
0.0000 [IU] | Freq: Three times a day (TID) | INTRAMUSCULAR | Status: DC
  Administered 2023-01-15 – 2023-01-17 (×4): 1 [IU] via SUBCUTANEOUS
  Filled 2023-01-14 (×5): qty 1

## 2023-01-14 NOTE — ED Notes (Signed)
Patient care taken, resting, placed in a gown and socks, gave a urinal and a warm blanket. No complaints at this time.

## 2023-01-14 NOTE — ED Notes (Signed)
Patient is being discharged from the Urgent Care and sent to the Emergency Department via POV . Per Dr.Brimage, patient is in need of higher level of care due to syncope & palpitations. Patient is aware and verbalizes understanding of plan of care. There were no vitals filed for this visit.

## 2023-01-14 NOTE — ED Triage Notes (Signed)
Pt to ED with family for LOC this am. Family reports he was standing up talking, eyes rolled back in head and pt went unconscious, was assisted to floor by family. Has been c/o dizziness. Reports some shob Hx afib Was told might be candidate for pacemaker

## 2023-01-14 NOTE — ED Provider Notes (Signed)
[facility] Provider Note    Event Date/Time   First MD Initiated Contact with Patient 01/14/23 1357     (approximate)   History   Loss of Consciousness   HPI Jerry Atkinson is a 89 y.o. male with HTN, DM2, HLD, paroxysmal A-fib on Eliquis presenting today for syncope.  Patient was up walking around near his son earlier today when his son noticed him stop, eyes rolled back of his head, and then started to fall towards the ground.  The son caught him before he fell and did not hit his head or anything else.  Patient came back to it briefly after that.  Patient does note intermittent episodes of lightheadedness recently.  Otherwise denies palpitations, chest pain, shortness of breath, nausea, diaphoresis.  Reportedly being seen by outpatient cardiologist where they were going to discuss given lower heart rate whether he needed a potential pacemaker but his next visit is not until February.     Physical Exam   Triage Vital Signs: ED Triage Vitals  Encounter Vitals Group     BP 01/14/23 1355 (!) 156/75     Systolic BP Percentile --      Diastolic BP Percentile --      Pulse Rate 01/14/23 1354 (!) 48     Resp 01/14/23 1355 18     Temp 01/14/23 1355 (!) 97.5 F (36.4 C)     Temp src --      SpO2 01/14/23 1355 98 %     Weight 01/14/23 1352 120 lb (54.4 kg)     Height 01/14/23 1352 5\' 5"  (1.651 m)     Head Circumference --      Peak Flow --      Pain Score 01/14/23 1352 0     Pain Loc --      Pain Education --      Exclude from Growth Chart --     Most recent vital signs: Vitals:   01/14/23 1355 01/14/23 1448  BP: (!) 156/75 (!) 165/61  Pulse:  (!) 52  Resp: 18 18  Temp: (!) 97.5 F (36.4 C)   SpO2: 98% 100%   Physical Exam: I have reviewed the vital signs and nursing notes. General: Awake, alert, no acute distress.  Nontoxic appearing. Head:  Atraumatic, normocephalic.   ENT:  EOM intact, PERRL. Oral mucosa is pink and moist with no  lesions. Neck: Neck is supple with full range of motion, No meningeal signs. Cardiovascular:  RRR, No murmurs. Peripheral pulses palpable and equal bilaterally. Respiratory:  Symmetrical chest wall expansion.  No rhonchi, rales, or wheezes.  Good air movement throughout.  No use of accessory muscles.   Musculoskeletal:  No cyanosis or edema. Moving extremities with full ROM Abdomen:  Soft, nontender, nondistended. Neuro:  GCS 15, moving all four extremities, interacting appropriately. Speech clear. Psych:  Calm, appropriate.   Skin:  Warm, dry, no rash.    ED Results / Procedures / Treatments   Labs (all labs ordered are listed, but only abnormal results are displayed) Labs Reviewed  BASIC METABOLIC PANEL - Abnormal; Notable for the following components:      Result Value   Glucose, Bld 129 (*)    BUN 28 (*)    All other components within normal limits  CBC - Abnormal; Notable for the following components:   Platelets 142 (*)    All other components within normal limits  MAGNESIUM - Abnormal; Notable for the following components:  Magnesium 2.6 (*)    All other components within normal limits  TROPONIN I (HIGH SENSITIVITY)     EKG My EKG interpretation: Rate of 48, sinus bradycardia, left bundle branch block.  No acute ST elevations or depressions.   RADIOLOGY    PROCEDURES:  Critical Care performed: No  Procedures   MEDICATIONS ORDERED IN ED: Medications - No data to display   IMPRESSION / MDM / ASSESSMENT AND PLAN / ED COURSE  I reviewed the triage vital signs and the nursing notes.                              Differential diagnosis includes, but is not limited to, symptomatic bradycardia, orthostatic hypotension, vasovagal syncope, cardiac arrhythmia, dehydration.  Patient's presentation is most consistent with acute presentation with potential threat to life or bodily function.  Patient is a 89 year old male presenting today for syncopal episode with  recent episodes of lightheadedness as well as documented bradycardia outpatient.  He was caught by his son and did not hit his head when falling down.  No acute complaints on arrival here.  EKG does show bradycardia in the mid 40s as well as persisting on telemetry monitoring in the room.  Laboratory workup largely reassuring aside from elevated magnesium at 2.6.  Given ongoing symptomatic bradycardia along with high risk syncope features, will admit patient to hospitalist service for further care.  Will have them discuss case with cardiology as it was previously discussed that patient may need pacemaker given his symptomatic bradycardia.  The patient is on the cardiac monitor to evaluate for evidence of arrhythmia and/or significant heart rate changes. Clinical Course as of 01/14/23 1523  Wed Jan 14, 2023  1450 CBC(!) Unremarkable [DW]  1455 Troponin I (High Sensitivity): 10 No chest pain symptoms at this time [DW]  1456 Basic metabolic panel(!) Unremarkable [DW]  1501 Magnesium(!): 2.6 [DW]    Clinical Course User Index [DW] Janith Lima, MD     FINAL CLINICAL IMPRESSION(S) / ED DIAGNOSES   Final diagnoses:  Syncope and collapse  Bradycardia     Rx / DC Orders   ED Discharge Orders     None        Note:  This document was prepared using Dragon voice recognition software and may include unintentional dictation errors.   Janith Lima, MD 01/14/23 [phone]

## 2023-01-14 NOTE — H&P (Signed)
History and Physical    Jerry Atkinson:[id number] DOB: 11-04-1930 DOA: 01/14/2023  PCP: [facility] (Confirm with patient/family/NH records and if not entered, this has to be entered at [facility] point of entry) Patient coming from: Home  I have personally briefly reviewed patient's old medical records in [facility]  Chief Complaint: I passed out  HPI: Jerry Atkinson is a 89 y.o. male with medical history significant of PAF on Eliquis, sinus bradycardia, LBBB, IIDM, HTN, basal cell carcinoma s/p radiation therapy, brought him in by family member for evaluation for syncope.  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  Patient remember he suddenly felt lightheaded then lost consciousness.  Episode lasted less than 1 minute and patient resumed consciousness himself with short period of confusion afterwards.  No loss control of urine or bowel movement.  Lately for the last few month patient has experienced frequent episode of lightheadedness, for which he attributed to him getting weak since earlier this year.  He went to see cardiology about 2 weeks ago, found the patient in bradycardia and discussed about close follow-up in the future and cardiology discontinue simvastatin at same visit.  Patient denied any chest pain cough shortness of breath urinary symptoms no diarrhea.  ED Course: Bradycardia heart rate 40-50s, blood pressure 150/75 nonhypoxic.  EKG showed sinus bradycardia.  Review of Systems: As per HPI otherwise 14 point review of systems negative.    Past Medical History:  Diagnosis Date   Actinic keratosis    Basal cell carcinoma 04/09/2020    R ant alar crease, EDC 06/18/20   BPH (benign prostatic hypertrophy)    Diabetes mellitus, type 2 (HCC)    GERD (gastroesophageal reflux disease)    Hypercholesteremia    Hypertension    Wears dentures    full upper   Wears hearing aid in both  ears     Past Surgical History:  Procedure Laterality Date   CATARACT EXTRACTION W/PHACO Right 09/10/2015   Procedure: CATARACT EXTRACTION PHACO AND INTRAOCULAR LENS PLACEMENT (IOC);  Surgeon: Jerry Hess, MD;  [facility];  Service: Ophthalmology;  Laterality: Right;  LEAVE MARTINS TOGETHER   CATARACT EXTRACTION W/PHACO Left 07/01/2021   Procedure: CATARACT EXTRACTION PHACO AND INTRAOCULAR LENS PLACEMENT (IOC) LEFT DIABETIC;  Surgeon: Nevada Crane, MD;  Location: [facility];  Service: Ophthalmology;  Laterality: Left;  Diabetic 7.49 00:53.9   REPLACEMENT TOTAL KNEE       reports that he has never smoked. He has never used smokeless tobacco. He reports that he does not drink alcohol. No history on file for drug use.  No Known Allergies  History reviewed. No pertinent family history.   Prior to Admission medications   Medication Sig Start Date End Date Taking? Authorizing Provider  acetaminophen (TYLENOL) 325 MG tablet Take by mouth.   Yes [provider]  aspirin 81 MG tablet Take 81 mg by mouth daily.   Yes [provider]  cyanocobalamin 1000 MCG tablet Take 1,000 mcg by mouth daily.   Yes [provider]  ELIQUIS 5 MG TABS tablet 5 mg 2 (two) times daily. 09/14/18  Yes [provider]  finasteride (PROSCAR) 5 MG tablet Take 5 mg by mouth daily.   Yes [provider]  glimepiride (AMARYL) 1 MG tablet Take 1 mg by mouth daily.   Yes [provider]  Blood Glucose  Calibration (ACCU-CHEK GUIDE CONTROL) LIQD  06/30/17   [provider]  Cholecalciferol (VITAMIN D3) 50 MCG (2000 UT) capsule Take 2,000 Units by mouth daily.    [provider]  gabapentin (NEURONTIN) 100 MG capsule Take 200 mg by mouth 2 (two) times daily.    [provider]  ramipril (ALTACE) 10 MG capsule Take by mouth. 02/04/18   [provider]  simvastatin (ZOCOR) 20 MG tablet Take 20 mg by  mouth daily.    [provider]  tamsulosin (FLOMAX) 0.4 MG CAPS capsule Take 0.4 mg by mouth daily. 07/21/17   [provider]    Physical Exam: Vitals:   01/14/23 1352 01/14/23 1354 01/14/23 1355 01/14/23 1448  BP:   (!) 156/75 (!) 165/61  Pulse:  (!) 48  (!) 52  Resp:   18 18  Temp:   (!) 97.5 F (36.4 C)   SpO2:   98% 100%  Weight: 54.4 kg     Height: 5\' 5"  (1.651 m)       Constitutional: NAD, calm, comfortable Vitals:   01/14/23 1352 01/14/23 1354 01/14/23 1355 01/14/23 1448  BP:   (!) 156/75 (!) 165/61  Pulse:  (!) 48  (!) 52  Resp:   18 18  Temp:   (!) 97.5 F (36.4 C)   SpO2:   98% 100%  Weight: 54.4 kg     Height: 5\' 5"  (1.651 m)      Eyes: PERRL, lids and conjunctivae normal ENMT: Mucous membranes are moist. Posterior pharynx clear of any exudate or lesions.Normal dentition.  Neck: normal, supple, no masses, no thyromegaly Respiratory: clear to auscultation bilaterally, no wheezing, no crackles. Normal respiratory effort. No accessory muscle use.  Cardiovascular: Regular rate and rhythm, no murmurs / rubs / gallops. No extremity edema. 2+ pedal pulses. No carotid bruits.  Abdomen: no tenderness, no masses palpated. No hepatosplenomegaly. Bowel sounds positive.  Musculoskeletal: no clubbing / cyanosis. No joint deformity upper and lower extremities. Good ROM, no contractures. Normal muscle tone.  Skin: no rashes, lesions, ulcers. No induration Neurologic: CN 2-12 grossly intact. Sensation intact, DTR normal. Strength 5/5 in all 4.  Psychiatric: Normal judgment and insight. Alert and oriented x 3. Normal mood.     Labs on Admission: I have personally reviewed following labs and imaging studies  CBC: Recent Labs  Lab 01/14/23 1355  WBC 4.5  HGB 14.7  HCT 44.5  MCV 98.2  PLT 142*   Basic Metabolic Panel: Recent Labs  Lab 01/14/23 1355  NA 136  K 4.6  CL 103  CO2 26  GLUCOSE 129*  BUN 28*  CREATININE 0.82  CALCIUM 9.0  MG 2.6*    GFR: Estimated Creatinine Clearance: 46.1 mL/min (by C-G formula based on SCr of 0.82 mg/dL). Liver Function Tests: No results for input(s): "AST", "ALT", "ALKPHOS", "BILITOT", "PROT", "ALBUMIN" in the last 168 hours. No results for input(s): "LIPASE", "AMYLASE" in the last 168 hours. No results for input(s): "AMMONIA" in the last 168 hours. Coagulation Profile: No results for input(s): "INR", "PROTIME" in the last 168 hours. Cardiac Enzymes: No results for input(s): "CKTOTAL", "CKMB", "CKMBINDEX", "TROPONINI" in the last 168 hours. BNP (last 3 results) No results for input(s): "PROBNP" in the last 8760 hours. HbA1C: No results for input(s): "HGBA1C" in the last 72 hours. CBG: Recent Labs  Lab 01/14/23 1310  GLUCAP 190*   Lipid Profile: No results for input(s): "CHOL", "HDL", "LDLCALC", "TRIG", "CHOLHDL", "LDLDIRECT" in the last 72 hours. Thyroid  Function Tests: No results for input(s): "TSH", "T4TOTAL", "FREET4", "T3FREE", "THYROIDAB" in the last 72 hours. Anemia Panel: No results for input(s): "VITAMINB12", "FOLATE", "FERRITIN", "TIBC", "IRON", "RETICCTPCT" in the last 72 hours. Urine analysis:    Component Value Date/Time   COLORURINE Yellow 02/28/2013 0933   APPEARANCEUR Clear 02/28/2013 0933   LABSPEC 1.018 02/28/2013 0933   PHURINE 5.0 02/28/2013 0933   GLUCOSEU 50 mg/dL 16/02/9603 5409   HGBUR 1+ 02/28/2013 0933   BILIRUBINUR Negative 02/28/2013 0933   KETONESUR Negative 02/28/2013 0933   PROTEINUR Negative 02/28/2013 0933   NITRITE Negative 02/28/2013 0933   LEUKOCYTESUR Negative 02/28/2013 0933    Radiological Exams on Admission: DG Chest 2 View  Result Date: 01/14/2023 CLINICAL DATA:  Shortness of breath EXAM: CHEST - 2 VIEW COMPARISON:  None Available. FINDINGS: Hyperinflation. Chronic lung changes. No consolidation, pneumothorax or effusion. No edema. Calcified aorta. Normal cardiopericardial silhouette. Degenerative changes seen of the spine and shoulders.  IMPRESSION: Hyperinflation.  Chronic changes.  No acute cardiopulmonary disease. Electronically Signed   By: Karen Kays M.D.   On: 01/14/2023 15:21    EKG: Independently reviewed. Sinus LBBB  Assessment/Plan Principal Problem:   Syncope Active Problems:   Syncope and collapse   Sinus bradycardia  (please populate well all problems here in Problem List. (For example, if patient is on BP meds at home and you resume or decide to hold them, it is a problem that needs to be her. Same for CAD, COPD, HLD and so on)  Syncope -Suspect symptomatic bradycardia.  Review ER telemonitoring record showed several episodes of sinus pauses 1-1.5 seconds. -Clinically suspect sick sinus syndrome -Secure text patient's cardiology Dr. Juliann Pares, probably will need inpatient pacemaker evaluation.  Discussed with patient and family at bedside, patient amenable to PPM  HTN -Stable, continue ACEI  IIDM -Glucose level stable, continue glimepiride  PAF -Sinus rhythm, continue Eliquis  DVT prophylaxis: Eliquis Code Status: Full code Family Communication: Son at bedside Disposition Plan: Expect less than 2 midnight hospital stay Consults called: Cardiology Admission status: Telemetry obs   Emeline General MD [facility] Pager [phone]  01/14/2023, 4:36 PM

## 2023-01-14 NOTE — Discharge Instructions (Addendum)

## 2023-01-15 ENCOUNTER — Inpatient Hospital Stay
Admit: 2023-01-15 | Discharge: 2023-01-15 | Disposition: A | Discharge status: Home / Self Care | Payer: Medicare Other | Attending: Student | Admitting: Student

## 2023-01-15 ENCOUNTER — Encounter: Payer: Self-pay | Admitting: Internal Medicine

## 2023-01-15 ENCOUNTER — Inpatient Hospital Stay: Admit: 2023-01-15 | Payer: Medicare Other

## 2023-01-15 DIAGNOSIS — Z85828 Personal history of other malignant neoplasm of skin: Secondary | ICD-10-CM | POA: Diagnosis not present

## 2023-01-15 DIAGNOSIS — I480 Paroxysmal atrial fibrillation: Secondary | ICD-10-CM | POA: Diagnosis present

## 2023-01-15 DIAGNOSIS — R55 Syncope and collapse: Secondary | ICD-10-CM | POA: Diagnosis present

## 2023-01-15 DIAGNOSIS — E7800 Pure hypercholesterolemia, unspecified: Secondary | ICD-10-CM | POA: Diagnosis present

## 2023-01-15 DIAGNOSIS — Z923 Personal history of irradiation: Secondary | ICD-10-CM | POA: Diagnosis not present

## 2023-01-15 DIAGNOSIS — R001 Bradycardia, unspecified: Secondary | ICD-10-CM | POA: Diagnosis present

## 2023-01-15 DIAGNOSIS — N400 Enlarged prostate without lower urinary tract symptoms: Secondary | ICD-10-CM | POA: Diagnosis present

## 2023-01-15 DIAGNOSIS — Z23 Encounter for immunization: Secondary | ICD-10-CM | POA: Diagnosis present

## 2023-01-15 DIAGNOSIS — Z7982 Long term (current) use of aspirin: Secondary | ICD-10-CM | POA: Diagnosis not present

## 2023-01-15 DIAGNOSIS — Z7901 Long term (current) use of anticoagulants: Secondary | ICD-10-CM | POA: Diagnosis not present

## 2023-01-15 DIAGNOSIS — I447 Left bundle-branch block, unspecified: Secondary | ICD-10-CM | POA: Diagnosis present

## 2023-01-15 DIAGNOSIS — Z5986 Financial insecurity: Secondary | ICD-10-CM | POA: Diagnosis not present

## 2023-01-15 DIAGNOSIS — I10 Essential (primary) hypertension: Secondary | ICD-10-CM | POA: Diagnosis present

## 2023-01-15 DIAGNOSIS — Z79899 Other long term (current) drug therapy: Secondary | ICD-10-CM | POA: Diagnosis not present

## 2023-01-15 DIAGNOSIS — I2510 Atherosclerotic heart disease of native coronary artery without angina pectoris: Secondary | ICD-10-CM | POA: Diagnosis present

## 2023-01-15 DIAGNOSIS — Z7984 Long term (current) use of oral hypoglycemic drugs: Secondary | ICD-10-CM | POA: Diagnosis not present

## 2023-01-15 DIAGNOSIS — E119 Type 2 diabetes mellitus without complications: Secondary | ICD-10-CM | POA: Diagnosis present

## 2023-01-15 DIAGNOSIS — Z96659 Presence of unspecified artificial knee joint: Secondary | ICD-10-CM | POA: Diagnosis present

## 2023-01-15 DIAGNOSIS — Z974 Presence of external hearing-aid: Secondary | ICD-10-CM | POA: Diagnosis not present

## 2023-01-15 LAB — ECHOCARDIOGRAM COMPLETE
AR max vel: 4.06 cm2
AV Area VTI: 3.48 cm2
AV Area mean vel: 4.18 cm2
AV Mean grad: 3 mmHg
AV Peak grad: 4.8 mmHg
Ao pk vel: 1.09 m/s
Area-P 1/2: 3.12 cm2
Height: 65 in
MV VTI: 3.14 cm2
S' Lateral: 2.8 cm
Weight: 1920 [oz_av]

## 2023-01-15 LAB — MAGNESIUM: Magnesium: 2.3 mg/dL (ref 1.7–2.4)

## 2023-01-15 LAB — CBG MONITORING, ED
Glucose-Capillary: 97 mg/dL (ref 70–99)
Glucose-Capillary: 120 mg/dL — ABNORMAL HIGH (ref 70–99)
Glucose-Capillary: 135 mg/dL — ABNORMAL HIGH (ref 70–99)
Glucose-Capillary: 131 mg/dL — ABNORMAL HIGH (ref 70–99)

## 2023-01-15 LAB — BASIC METABOLIC PANEL
Anion gap: 6 (ref 5–15)
BUN: 18 mg/dL (ref 8–23)
CO2: 24 mmol/L (ref 22–32)
Calcium: 8.7 mg/dL — ABNORMAL LOW (ref 8.9–10.3)
Chloride: 103 mmol/L (ref 98–111)
Creatinine, Ser: 0.61 mg/dL (ref 0.61–1.24)
GFR, Estimated: >60 mL/min (ref >60)
Glucose, Bld: 118 mg/dL — ABNORMAL HIGH (ref 70–99)
Potassium: 4.3 mmol/L (ref 3.5–5.1)
Sodium: 133 mmol/L — ABNORMAL LOW (ref 135–145)

## 2023-01-15 LAB — CBC
HCT: 45.6 % (ref 39.0–52.0)
Hemoglobin: 14.9 g/dL (ref 13.0–17.0)
MCH: 31.9 pg (ref 26.0–34.0)
MCHC: 32.7 g/dL (ref 30.0–36.0)
MCV: 97.6 fL (ref 80.0–100.0)
Platelets: 132 10*3/uL — ABNORMAL LOW (ref 150–400)
RBC: 4.67 MIL/uL (ref 4.22–5.81)
RDW: 13.4 % (ref 11.5–15.5)
WBC: 3.8 10*3/uL — ABNORMAL LOW (ref 4.0–10.5)
nRBC: 0 % (ref 0.0–0.2)

## 2023-01-15 LAB — GLUCOSE, CAPILLARY: Glucose-Capillary: 141 mg/dL — ABNORMAL HIGH (ref 70–99)

## 2023-01-15 MED ORDER — SODIUM CHLORIDE 0.9 % IV SOLN
80.0000 mg | INTRAVENOUS | Status: AC
  Administered 2023-01-16: 80 mg
  Filled 2023-01-15 (×2): qty 2

## 2023-01-15 MED ORDER — SODIUM CHLORIDE 0.9 % IV SOLN: INTRAVENOUS | Status: DC

## 2023-01-15 MED ORDER — CEFAZOLIN SODIUM-DEXTROSE 2-4 GM/100ML-% IV SOLN: 2.0000 g | INTRAVENOUS | Status: DC

## 2023-01-15 MED ORDER — SIMVASTATIN 20 MG PO TABS
20.0000 mg | ORAL_TABLET | Freq: Every day | ORAL | Status: DC
  Administered 2023-01-15 – 2023-01-17 (×3): 20 mg via ORAL
  Filled 2023-01-15: qty 1
  Filled 2023-01-15: qty 2
  Filled 2023-01-15: qty 1

## 2023-01-15 MED ORDER — INFLUENZA VAC A&B SURF ANT ADJ 0.5 ML IM SUSY
0.5000 mL | PREFILLED_SYRINGE | INTRAMUSCULAR | Status: AC
  Administered 2023-01-16: 0.5 mL via INTRAMUSCULAR
  Filled 2023-01-15 (×2): qty 0.5

## 2023-01-15 NOTE — ED Notes (Signed)
Pt given warm blankets and repositioned, pt denies other needs at this time

## 2023-01-15 NOTE — Consult Note (Signed)
[facility] CLINIC CARDIOLOGY CONSULT NOTE       Patient ID: BARNABAS BILLOCK MRN: [id number] DOB/AGE: December 01, 1930 89 y.o.  Admit date: 01/14/2023 Referring Physician Dr. Mikey College Primary Physician Laren Everts, NP Primary Cardiologist Dr. Juliann Pares Reason for Consultation syncope  HPI: ERVAN OMAR is a 89 y.o. male  with a past medical history of paroxysmal atrial fibrillation (eliquis), bradycardia, nonobstructive coronary artery disease, chronic LBBB, carotid artery stenosis, hypertension, hyperlipidemia, diabetes who presented to the ED on 01/14/2023 for syncope. Cardiology was consulted for further evaluation and management.   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  Shortly after the dizziness began he "blacked out" for just a few seconds.  This episode was witnessed by his son who was present and caught the patient whenever he passed out so that he did not fall.  Loss of consciousness lasted less than 1 minute.  Patient reports that prior to this episode and once he regained consciousness he overall felt well.  He denies any associated chest pain.  He does endorse some shortness of breath when he heavily exerts himself.  Reports that he occasionally has palpitations, this is a chronic issue for him and not significantly bothersome.  Evaluation in the ED notable for heart rate averaging in the 50s with lows in the 40s, some episodes of pauses less than 3 seconds noted on telemetry.  EKG with sinus bradycardia rate 48 with chronic left bundle branch block.  Lab work notable for creatinine 0.61, potassium 4.3, magnesium 2.3, hemoglobin 14.9.  At the time of my evaluation this morning, the patient reports he is feeling better overall.  Denies any additional dizziness or syncope.  States that the dizziness has been ongoing for the last year but recently seems to have become more frequent.  States that the episodes of dizziness come  and go.  He reports that he is active and takes care of his wife who had a stroke last year.  Patient and his son reports that he does all of the housework, cooking and he also stays active by gardening.  Patient notes some mild functional decline over the last few years but feels this is related to age and overall is able to do the things he wishes to.   Review of systems complete and found to be negative unless listed above    Past Medical History:  Diagnosis Date   Actinic keratosis    Basal cell carcinoma 04/09/2020    R ant alar crease, EDC 06/18/20   BPH (benign prostatic hypertrophy)    Diabetes mellitus, type 2 (HCC)    GERD (gastroesophageal reflux disease)    Hypercholesteremia    Hypertension    Wears dentures    full upper   Wears hearing aid in both ears     Past Surgical History:  Procedure Laterality Date   CATARACT EXTRACTION W/PHACO Right 09/10/2015   Procedure: CATARACT EXTRACTION PHACO AND INTRAOCULAR LENS PLACEMENT (IOC);  Surgeon: Sherald Hess, MD;  [facility];  Service: Ophthalmology;  Laterality: Right;  LEAVE MARTINS TOGETHER   CATARACT EXTRACTION W/PHACO Left 07/01/2021   Procedure: CATARACT EXTRACTION PHACO AND INTRAOCULAR LENS PLACEMENT (IOC) LEFT DIABETIC;  Surgeon: Nevada Crane, MD;  Location: [facility];  Service: Ophthalmology;  Laterality: Left;  Diabetic 7.49 00:53.9   REPLACEMENT TOTAL KNEE      (Not in a hospital admission)  Social History   Socioeconomic  History   Marital status: Married    Spouse name: Not on file   Number of children: Not on file   Years of education: Not on file   Highest education level: Not on file  Occupational History   Not on file  Tobacco Use   Smoking status: Never   Smokeless tobacco: Never  Vaping Use   Vaping status: Never Used  Substance and Sexual Activity   Alcohol use: No   Drug use: Not on file   Sexual activity: Not on file  Other Topics Concern   Not  on file  Social History Narrative   Not on file   Social Determinants of Health   Financial Resource Strain: Medium Risk (12/11/2022)   Received from [facility]   Overall Financial Resource Strain (CARDIA)    Difficulty of Paying Living Expenses: Somewhat hard  Food Insecurity: No Food Insecurity (01/15/2023)   Hunger Vital Sign    Worried About Running Out of Food in the Last Year: Never true    Ran Out of Food in the Last Year: Never true  Transportation Needs: No Transportation Needs (01/15/2023)   PRAPARE - Administrator, Civil Service (Medical): No    Lack of Transportation (Non-Medical): No  Physical Activity: Not on file  Stress: Not on file  Social Connections: Not on file  Intimate Partner Violence: Not At Risk (01/15/2023)   Humiliation, Afraid, Rape, and Kick questionnaire    Fear of Current or Ex-Partner: No    Emotionally Abused: No    Physically Abused: No    Sexually Abused: No    History reviewed. No pertinent family history.   Vitals:   01/15/23 1201 01/15/23 1230 01/15/23 1300 01/15/23 1330  BP:  (!) 141/64 128/65 (!) 146/63  Pulse:  (!) 50 (!) 53 (!) 50  Resp:  20 20 (!) 21  Temp: 97.7 F (36.5 C)     TempSrc: Oral     SpO2:  97% 99% 96%  Weight:      Height:        PHYSICAL EXAM General: Well-appearing elderly male, well nourished, in no acute distress sitting upright in hospital bed with son at bedside. HEENT: Normocephalic and atraumatic. Neck: No JVD.  Lungs: Normal respiratory effort on room air. Clear bilaterally to auscultation. No wheezes, crackles, rhonchi.  Heart: Slow rate regular rhythm. Normal S1 and S2 without gallops or murmurs.  Abdomen: Non-distended appearing.  Msk: Normal strength and tone for age. Extremities: Warm and well perfused. No clubbing, cyanosis. No edema.  Neuro: Alert and oriented X 3. Psych: Answers questions appropriately.   Labs: Basic Metabolic Panel: Recent Labs    01/14/23 1355  01/15/23 0801  NA 136 133*  K 4.6 4.3  CL 103 103  CO2 26 24  GLUCOSE 129* 118*  BUN 28* 18  CREATININE 0.82 0.61  CALCIUM 9.0 8.7*  MG 2.6* 2.3   Liver Function Tests: No results for input(s): "AST", "ALT", "ALKPHOS", "BILITOT", "PROT", "ALBUMIN" in the last 72 hours. No results for input(s): "LIPASE", "AMYLASE" in the last 72 hours. CBC: Recent Labs    01/14/23 1355 01/15/23 0801  WBC 4.5 3.8*  HGB 14.7 14.9  HCT 44.5 45.6  MCV 98.2 97.6  PLT 142* 132*   Cardiac Enzymes: Recent Labs    01/14/23 1355 01/14/23 1701  TROPONINIHS 10 10   BNP: No results for input(s): "BNP" in the last 72 hours. D-Dimer: No results for input(s): "DDIMER"  in the last 72 hours. Hemoglobin A1C: Recent Labs    01/14/23 1355  HGBA1C 6.2*   Fasting Lipid Panel: No results for input(s): "CHOL", "HDL", "LDLCALC", "TRIG", "CHOLHDL", "LDLDIRECT" in the last 72 hours. Thyroid Function Tests: Recent Labs    01/14/23 1355  TSH 0.826   Anemia Panel: No results for input(s): "VITAMINB12", "FOLATE", "FERRITIN", "TIBC", "IRON", "RETICCTPCT" in the last 72 hours.   Radiology: DG Chest 2 View  Result Date: 01/14/2023 CLINICAL DATA:  Shortness of breath EXAM: CHEST - 2 VIEW COMPARISON:  None Available. FINDINGS: Hyperinflation. Chronic lung changes. No consolidation, pneumothorax or effusion. No edema. Calcified aorta. Normal cardiopericardial silhouette. Degenerative changes seen of the spine and shoulders. IMPRESSION: Hyperinflation.  Chronic changes.  No acute cardiopulmonary disease. Electronically Signed   By: Karen Kays M.D.   On: 01/14/2023 15:21    ECHO pending  TELEMETRY reviewed by me [facility]) 01/15/2023 : sinus bradycardia LBBB rate 50s  EKG reviewed by me: sinus bradycardia rate 48 bpm LBBB  Data reviewed by me [facility]) 01/15/2023: last 24h vitals tele labs imaging I/O ED provider note, admission H&P  Principal Problem:   Syncope Active Problems:   Syncope and collapse   Sinus  bradycardia    ASSESSMENT AND PLAN:  KELSTON MASAR is a 89 y.o. male  with a past medical history of paroxysmal atrial fibrillation (eliquis), bradycardia, nonobstructive coronary artery disease, chronic LBBB, carotid artery stenosis, hypertension, hyperlipidemia, diabetes who presented to the ED on 01/14/2023 for syncope. Cardiology was consulted for further evaluation and management.   # Symptomatic bradycardia # Syncope Patient with episode of syncope yesterday with associated dizziness. Has had episodes of dizziness over the last year, this is the first episode of syncope he has experienced. Episode lasted less than one minute and was witnessed. Noted to be bradycardic on telemetry to the 40s. ?sinus pauses on tele, less than 3 seconds.  -Plan for dual chamber PPM implantation tomorrow with Dr. Darrold Junker.  -Continue to monitor HR closely.   # Paroxysmal atrial fibrillation Patient with hx of pAF on long-term anticoagulation with Eliquis. Last dose this AM. Currently in sinus on telemetry.  -Hold eliquis for pacemaker implant tomorrow. Will likely plan to restart Sunday.   # Coronary artery disease # Carotid artery stenosis # Hyperlipidemia # Hypertension Patient with history of remote LHC which showed nonobstructive CAD. CTA neck 2019 with significant carotid stenosis, previously followed by vascular surgery. Has been holding simvastatin for 3 weeks due to leg weakness but feels symptoms have not improved off this medication.  -Restart home simvastatin.  -Continue aspirin 81 mg daily.  -Continue home ramipril 10 mg daily.    This patient's plan of care was discussed and created with Dr. Juliann Pares and he is in agreement.  Signed: Gale Journey, PA-C  01/15/2023, 2:21 PM [facility] Cardiology

## 2023-01-15 NOTE — Progress Notes (Signed)
*  PRELIMINARY RESULTS* Echocardiogram 2D Echocardiogram has been performed.  Cristela Blue 01/15/2023, 2:27 PM

## 2023-01-15 NOTE — ED Notes (Signed)
Pt given ice cream, wife at bedside

## 2023-01-15 NOTE — Progress Notes (Signed)
[facility]  - [place] at [facility]   PATIENT NAME: Jerry Atkinson    MR#:  [id number]  DATE OF BIRTH:  April 04, 1931  SUBJECTIVE:  seen earlier. No family member was there at bedside during my evaluation. Patient resting well. No issues per RN. Discussed with patient and he is aware of pacemaker placement scheduled for tomorrow.    VITALS:  Blood pressure (!) 146/63, pulse (!) 50, temperature 97.7 F (36.5 C), temperature source Oral, resp. rate (!) 21, height 5\' 5"  (1.651 m), weight 54.4 kg, SpO2 96%.  PHYSICAL EXAMINATION:   GENERAL:  89 y.o.-year-old patient with no acute distress.  LUNGS: Normal breath sounds bilaterally, no wheezing CARDIOVASCULAR: S1, S2 normal. No murmur   ABDOMEN: Soft, nontender, nondistended. Bowel sounds present.  EXTREMITIES: No  edema b/l.    NEUROLOGIC: nonfocal  patient is alert and awake SKIN: No obvious rash, lesion, or ulcer.   LABORATORY PANEL:  CBC Recent Labs  Lab 01/15/23 0801  WBC 3.8*  HGB 14.9  HCT 45.6  PLT 132*    Chemistries  Recent Labs  Lab 01/15/23 0801  NA 133*  K 4.3  CL 103  CO2 24  GLUCOSE 118*  BUN 18  CREATININE 0.61  CALCIUM 8.7*  MG 2.3   Cardiac Enzymes No results for input(s): "TROPONINI" in the last 168 hours. RADIOLOGY:  DG Chest 2 View  Result Date: 01/14/2023 CLINICAL DATA:  Shortness of breath EXAM: CHEST - 2 VIEW COMPARISON:  None Available. FINDINGS: Hyperinflation. Chronic lung changes. No consolidation, pneumothorax or effusion. No edema. Calcified aorta. Normal cardiopericardial silhouette. Degenerative changes seen of the spine and shoulders. IMPRESSION: Hyperinflation.  Chronic changes.  No acute cardiopulmonary disease. Electronically Signed   By: Karen Kays M.D.   On: 01/14/2023 15:21    Assessment and Plan  Jerry Atkinson is a 89 y.o. male with medical history significant of PAF on Eliquis, sinus bradycardia, LBBB, IIDM, HTN, basal cell carcinoma s/p radiation therapy,  brought him in by family member for evaluation for syncope.   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  Patient remember he suddenly felt lightheaded then lost consciousness.  Episode lasted less than 1 minute and patient resumed consciousness himself with short period of confusion afterwards  He went to see cardiology about 2 weeks ago, found the patient in bradycardia and discussed about close follow-up in the future and cardiology discontinue simvastatin at same visit   In the ER, Bradycardia heart rate 40-50s, blood pressure 150/75 nonhypoxic.  EKG showed sinus bradycardia.   Syncope  symptomatic bradycardia.  -- Clinically suspect sick sinus syndrome -- patient seen by [facility] cardiology. Discussion was made with patient and family regarding a permanent pacemaker placement. Patient agreeable with it. -- Blood pressure stable. Currently not having any symptoms.   HTN -Stable, continue ACEI   DM--2 w/o complication -Glucose level stable, continue glimepiride   PAF chronic anticoagulation with eliquis -Sinus rhythm -- hold eliquis as per cardiology request given pacemaker placement schedule tomorrow   DVT prophylaxis: SCD. Eliquis on hold Code Status: Full code Family Communication:none today Disposition Plan: home in 1-2 days Level of care: Telemetry Cardiac Status is: Inpatient Remains inpatient appropriate because: symptomatic bradycardia. Patient scheduled to get pacemaker tomorrow    TOTAL TIME TAKING CARE OF THIS PATIENT: 35 minutes.  >50% time spent on counselling and coordination of care  Note: This dictation  was prepared with Dragon dictation along with smaller phrase technology. Any transcriptional errors that result from this process are unintentional.  Enedina Finner M.D    [facility]   CC: Primary care physician; [facility]

## 2023-01-16 ENCOUNTER — Encounter
Admission: EM | Disposition: A | Discharge status: Home / Self Care | Payer: Self-pay | Source: Home / Self Care | Attending: Internal Medicine

## 2023-01-16 ENCOUNTER — Encounter: Payer: Self-pay | Admitting: Cardiology

## 2023-01-16 DIAGNOSIS — R001 Bradycardia, unspecified: Secondary | ICD-10-CM | POA: Diagnosis not present

## 2023-01-16 DIAGNOSIS — R55 Syncope and collapse: Secondary | ICD-10-CM | POA: Diagnosis not present

## 2023-01-16 HISTORY — PX: PACEMAKER IMPLANT: EP1218

## 2023-01-16 LAB — BASIC METABOLIC PANEL
Anion gap: 5 (ref 5–15)
BUN: 22 mg/dL (ref 8–23)
CO2: 26 mmol/L (ref 22–32)
Calcium: 8.6 mg/dL — ABNORMAL LOW (ref 8.9–10.3)
Chloride: 104 mmol/L (ref 98–111)
Creatinine, Ser: 0.77 mg/dL (ref 0.61–1.24)
GFR, Estimated: >60 mL/min (ref >60)
Glucose, Bld: 110 mg/dL — ABNORMAL HIGH (ref 70–99)
Potassium: 4 mmol/L (ref 3.5–5.1)
Sodium: 135 mmol/L (ref 135–145)

## 2023-01-16 LAB — GLUCOSE, CAPILLARY
Glucose-Capillary: 102 mg/dL — ABNORMAL HIGH (ref 70–99)
Glucose-Capillary: 108 mg/dL — ABNORMAL HIGH (ref 70–99)
Glucose-Capillary: 111 mg/dL — ABNORMAL HIGH (ref 70–99)
Glucose-Capillary: 145 mg/dL — ABNORMAL HIGH (ref 70–99)
Glucose-Capillary: 272 mg/dL — ABNORMAL HIGH (ref 70–99)
Glucose-Capillary: 138 mg/dL — ABNORMAL HIGH (ref 70–99)
Glucose-Capillary: 115 mg/dL — ABNORMAL HIGH (ref 70–99)

## 2023-01-16 LAB — SURGICAL PCR SCREEN
MRSA, PCR: NEGATIVE
Staphylococcus aureus: NEGATIVE

## 2023-01-16 LAB — CBC
HCT: 43.6 % (ref 39.0–52.0)
Hemoglobin: 14.7 g/dL (ref 13.0–17.0)
MCH: 32.5 pg (ref 26.0–34.0)
MCHC: 33.7 g/dL (ref 30.0–36.0)
MCV: 96.2 fL (ref 80.0–100.0)
Platelets: 138 10*3/uL — ABNORMAL LOW (ref 150–400)
RBC: 4.53 MIL/uL (ref 4.22–5.81)
RDW: 13.2 % (ref 11.5–15.5)
WBC: 3.7 10*3/uL — ABNORMAL LOW (ref 4.0–10.5)
nRBC: 0 % (ref 0.0–0.2)

## 2023-01-16 SURGERY — PACEMAKER IMPLANT
Anesthesia: Moderate Sedation

## 2023-01-16 MED ORDER — HEPARIN (PORCINE) IN NACL 1000-0.9 UT/500ML-% IV SOLN
INTRAVENOUS | Status: DC | PRN
  Administered 2023-01-16: 500 mL

## 2023-01-16 MED ORDER — ONDANSETRON HCL 4 MG/2ML IJ SOLN: 4.0000 mg | Freq: Four times a day (QID) | INTRAMUSCULAR | Status: DC | PRN

## 2023-01-16 MED ORDER — CEFAZOLIN SODIUM-DEXTROSE 2-4 GM/100ML-% IV SOLN
INTRAVENOUS | Status: AC
  Filled 2023-01-16: qty 100

## 2023-01-16 MED ORDER — CEFAZOLIN SODIUM-DEXTROSE 1-4 GM/50ML-% IV SOLN
INTRAVENOUS | Status: AC | PRN
  Administered 2023-01-16: 2 g via INTRAVENOUS

## 2023-01-16 MED ORDER — MIDAZOLAM HCL 2 MG/2ML IJ SOLN
INTRAMUSCULAR | Status: AC
  Filled 2023-01-16: qty 2

## 2023-01-16 MED ORDER — MIDAZOLAM HCL 2 MG/2ML IJ SOLN
INTRAMUSCULAR | Status: DC | PRN
  Administered 2023-01-16: .5 mg via INTRAVENOUS

## 2023-01-16 MED ORDER — CEPHALEXIN 500 MG PO CAPS
500.0000 mg | ORAL_CAPSULE | Freq: Two times a day (BID) | ORAL | Status: DC
  Filled 2023-01-16: qty 1

## 2023-01-16 MED ORDER — SODIUM CHLORIDE 0.9 % IV SOLN: INTRAVENOUS | Status: DC | PRN

## 2023-01-16 MED ORDER — LIDOCAINE HCL (PF) 1 % IJ SOLN
INTRAMUSCULAR | Status: DC | PRN
  Administered 2023-01-16: 30 mL

## 2023-01-16 MED ORDER — CEFAZOLIN SODIUM-DEXTROSE 1-4 GM/50ML-% IV SOLN
1.0000 g | Freq: Four times a day (QID) | INTRAVENOUS | Status: AC
  Administered 2023-01-16 – 2023-01-17 (×3): 1 g via INTRAVENOUS
  Filled 2023-01-16 (×3): qty 50

## 2023-01-16 MED ORDER — ACETAMINOPHEN 325 MG PO TABS
325.0000 mg | ORAL_TABLET | ORAL | Status: DC | PRN
  Administered 2023-01-16: 650 mg via ORAL
  Administered 2023-01-17: 325 mg via ORAL
  Filled 2023-01-16 (×2): qty 2

## 2023-01-16 MED ORDER — FENTANYL CITRATE (PF) 100 MCG/2ML IJ SOLN
INTRAMUSCULAR | Status: DC | PRN
  Administered 2023-01-16: 12.5 ug via INTRAVENOUS

## 2023-01-16 MED ORDER — IOHEXOL 300 MG/ML  SOLN
INTRAMUSCULAR | Status: DC | PRN
  Administered 2023-01-16: 20 mL

## 2023-01-16 MED ORDER — LIDOCAINE HCL 1 % IJ SOLN
INTRAMUSCULAR | Status: AC
  Filled 2023-01-16: qty 60

## 2023-01-16 MED ORDER — FENTANYL CITRATE (PF) 100 MCG/2ML IJ SOLN
INTRAMUSCULAR | Status: AC
  Filled 2023-01-16: qty 2

## 2023-01-16 SURGICAL SUPPLY — 18 items
CABLE SURG 12 DISP A/V CHANNEL (MISCELLANEOUS) IMPLANT
DEVICE DSSCT PLSMBLD 3.0S LGHT (MISCELLANEOUS) IMPLANT
DRAPE INCISE 23X17 STRL (DRAPES) IMPLANT
DRAPE INCISE IOBAN 23X17 STRL (DRAPES) ×1 IMPLANT
IPG PACE AZUR XT DR MRI W1DR01 (Pacemaker) IMPLANT
LEAD CAPSURE NOVUS 5076-52CM (Lead) IMPLANT
LEAD CAPSURE NOVUS 5076-58CM (Lead) IMPLANT
PACE AZURE XT DR MRI W1DR01 (Pacemaker) ×1 IMPLANT
PAD ELECT DEFIB RADIOL ZOLL (MISCELLANEOUS) IMPLANT
PLASMABLADE 3.0S W/LIGHT (MISCELLANEOUS) ×1
SHEATH 7FR PRELUDE SNAP 13 (SHEATH) IMPLANT
SHEATH 9FR PRELUDE SNAP 13 (SHEATH) IMPLANT
SUT SILK 0 FSL (SUTURE) IMPLANT
SUT VIC AB 2-0 CT1 27 (SUTURE) ×1
SUT VIC AB 2-0 CT1 TAPERPNT 27 (SUTURE) IMPLANT
SUT VICRYL 4-0 27 PS-2 BARIAT (SUTURE) ×1
SUTURE VICRYL 4-0 27 PS-2 BART (SUTURE) IMPLANT
TRAY PACEMAKER INSERTION (PACKS) ×1 IMPLANT

## 2023-01-16 NOTE — Progress Notes (Addendum)
[facility]IN[facility] CARDIOLOGY CONSULT NOTE       Patient ID: Jerry Atkinson MRN: [id number] DOB/AGE: 05-13-1930 89 y.o.  Admit date: 01/14/2023 Referring Physician Dr. Mikey College Primary Physician Laren Everts, NP Primary Cardiologist Dr. Juliann Pares Reason for Consultation syncope  HPI: Jerry Atkinson is a 89 y.o. male  with a past medical history of paroxysmal atrial fibrillation (eliquis), bradycardia, nonobstructive coronary artery disease, chronic LBBB, carotid artery stenosis, hypertension, hyperlipidemia, diabetes who presented to the ED on 01/14/2023 for syncope. Cardiology was consulted for further evaluation and management.   Interval history: -Patient feeling well this AM, denies any episodes of dizziness or syncope overnight. Echo results reviewed with patient. -HR averaging in the 50s on telemetry. Episode of tachycardia in the low 100s overnight.  -Denies chest pain, palpitations, shortness of breath.    Review of systems complete and found to be negative unless listed above    Past Medical History:  Diagnosis Date   Actinic keratosis    Basal cell carcinoma 04/09/2020    R ant alar crease, EDC 06/18/20   BPH (benign prostatic hypertrophy)    Diabetes mellitus, type 2 (HCC)    GERD (gastroesophageal reflux disease)    Hypercholesteremia    Hypertension    Wears dentures    full upper   Wears hearing aid in both ears     Past Surgical History:  Procedure Laterality Date   CATARACT EXTRACTION W/PHACO Right 09/10/2015   Procedure: CATARACT EXTRACTION PHACO AND INTRAOCULAR LENS PLACEMENT (IOC);  Surgeon: Sherald Hess, MD;  Location[facility];  Service: Ophthalmology;  Laterality: Right;  LEAVE MARTINS TOGETHER   CATARACT EXTRACTION W/PHACO Left 07/01/2021   Procedure: CATARACT EXTRACTION PHACO AND INTRAOCULAR LENS PLACEMENT (IOC) LEFT DIABETIC;  Surgeon: Nevada Crane, MD;  Location: [facility];  Service: Ophthalmology;  Laterality:  Left;  Diabetic 7.49 00:53.9   REPLACEMENT TOTAL KNEE      Medications Prior to Admission  Medication Sig Dispense Refill Last Dose   acetaminophen (TYLENOL) 325 MG tablet Take by mouth.   prn at unk   ascorbic acid (VITAMIN C) 500 MG tablet Take 500 mg by mouth daily.   01/14/2023   aspirin 81 MG tablet Take 81 mg by mouth daily.   01/14/2023   Cholecalciferol (VITAMIN D3) 50 MCG (2000 UT) capsule Take 2,000 Units by mouth daily.   01/14/2023   cyanocobalamin 1000 MCG tablet Take 1,000 mcg by mouth daily.   01/14/2023   ELIQUIS 5 MG TABS tablet 5 mg 2 (two) times daily.   01/14/2023 at 0630   finasteride (PROSCAR) 5 MG tablet Take 5 mg by mouth daily.   01/14/2023   gabapentin (NEURONTIN) 100 MG capsule Take 200 mg by mouth 2 (two) times daily.   01/14/2023   glimepiride (AMARYL) 1 MG tablet Take 1 mg by mouth daily.   01/14/2023   Multiple Vitamins-Minerals (PRESERVISION AREDS 2 PO) Take 1 capsule by mouth daily.   01/14/2023   oxybutynin (DITROPAN-XL) 10 MG 24 hr tablet Take 10 mg by mouth daily.   01/14/2023   ramipril (ALTACE) 10 MG capsule Take by mouth.   01/14/2023   sertraline (ZOLOFT) 50 MG tablet Take 1 tablet by mouth daily.   01/14/2023   tamsulosin (FLOMAX) 0.4 MG CAPS capsule Take 0.8 mg by mouth daily.   01/14/2023 at am   Blood Glucose Calibration (ACCU-CHEK GUIDE CONTROL) LIQD       simvastatin (ZOCOR) 20 MG tablet Take 20  mg by mouth daily. (Patient not taking: Reported on 01/14/2023)   Not Taking   Social History   Socioeconomic History   Marital status: Married    Spouse name: Not on file   Number of children: Not on file   Years of education: Not on file   Highest education level: Not on file  Occupational History   Not on file  Tobacco Use   Smoking status: Never   Smokeless tobacco: Never  Vaping Use   Vaping status: Never Used  Substance and Sexual Activity   Alcohol use: No   Drug use: Not on file   Sexual activity: Not on file  Other Topics Concern   Not on file  Social  History Narrative   Not on file   Social Determinants of Health   Financial Resource Strain: Medium Risk (12/11/2022)   Received from [facility] [facility]   Overall Financial Resource Strain (CARDIA)    Difficulty of Paying Living Expenses: Somewhat hard  Food Insecurity: No Food Insecurity (01/15/2023)   Hunger Vital Sign    Worried About Running Out of Food in the Last Year: Never true    Ran Out of Food in the Last Year: Never true  Transportation Needs: No Transportation Needs (01/15/2023)   PRAPARE - Administrator, Civil Service (Medical): No    Lack of Transportation (Non-Medical): No  Physical Activity: Not on file  Stress: Not on file  Social Connections: Not on file  Intimate Partner Violence: Not At Risk (01/15/2023)   Humiliation, Afraid, Rape, and Kick questionnaire    Fear of Current or Ex-Partner: No    Emotionally Abused: No    Physically Abused: No    Sexually Abused: No    History reviewed. No pertinent family history.   Vitals:   01/15/23 2327 01/16/23 0552 01/16/23 0734 01/16/23 0950  BP: (!) 153/80 (!) 162/62 (!) 158/57 (!) 178/63  Pulse: (!) 52 (!) 56 (!) 52 (!) 53  Resp: 18 18 17 14   Temp: (!) 97.5 F (36.4 C) 98 F (36.7 C) (!) 97.5 F (36.4 C) 98.7 F (37.1 C)  TempSrc:   Oral Oral  SpO2: 99% 99% 98% 97%  Weight:      Height:        PHYSICAL EXAM General: Well-appearing elderly male, well nourished, in no acute distress sitting upright in hospital bed with son at bedside. HEENT: Normocephalic and atraumatic. Neck: No JVD.  Lungs: Normal respiratory effort on room air. Clear bilaterally to auscultation. No wheezes, crackles, rhonchi.  Heart: Slow rate regular rhythm. Normal S1 and S2 without gallops or murmurs.  Abdomen: Non-distended appearing.  Msk: Normal strength and tone for age. Extremities: Warm and well perfused. No clubbing, cyanosis. No edema.  Neuro: Alert and oriented X 3. Psych: Answers questions appropriately.    Labs: Basic Metabolic Panel: Recent Labs    01/14/23 1355 01/15/23 0801 01/16/23 0243  NA 136 133* 135  K 4.6 4.3 4.0  CL 103 103 104  CO2 26 24 26   GLUCOSE 129* 118* 110*  BUN 28* 18 22  CREATININE 0.82 0.61 0.77  CALCIUM 9.0 8.7* 8.6*  MG 2.6* 2.3  --    Liver Function Tests: No results for input(s): "AST", "ALT", "ALKPHOS", "BILITOT", "PROT", "ALBUMIN" in the last 72 hours. No results for input(s): "LIPASE", "AMYLASE" in the last 72 hours. CBC: Recent Labs    01/15/23 0801 01/16/23 0243  WBC 3.8* 3.7*  HGB 14.9 14.7  HCT 45.6 43.6  MCV 97.6 96.2  PLT 132* 138*   Cardiac Enzymes: Recent Labs    01/14/23 1355 01/14/23 1701  TROPONINIHS 10 10   BNP: No results for input(s): "BNP" in the last 72 hours. D-Dimer: No results for input(s): "DDIMER" in the last 72 hours. Hemoglobin A1C: Recent Labs    01/14/23 1355  HGBA1C 6.2*   Fasting Lipid Panel: No results for input(s): "CHOL", "HDL", "LDLCALC", "TRIG", "CHOLHDL", "LDLDIRECT" in the last 72 hours. Thyroid Function Tests: Recent Labs    01/14/23 1355  TSH 0.826   Anemia Panel: No results for input(s): "VITAMINB12", "FOLATE", "FERRITIN", "TIBC", "IRON", "RETICCTPCT" in the last 72 hours.   Radiology: ECHOCARDIOGRAM COMPLETE  Result Date: 01/15/2023    ECHOCARDIOGRAM REPORT   Patient Name:   Jerry Atkinson Date of Exam: 01/15/2023 Medical Rec #:  [id number]      Height:       65.0 in Accession #:    [id number]     Weight:       120.0 lb Date of Birth:  [birth year]      BSA:          1.592 m Patient Age:    89 years       BP:           128/65 mmHg Patient Gender: M              HR:           53 bpm. Exam Location:  [facility] Procedure: 2D Echo, Cardiac Doppler and Color Doppler Indications:     Mitral valve disorder, other non-rheumatic I34.2  History:         Patient has no prior history of Echocardiogram examinations.                  Risk Factors:Diabetes and Hypertension.  Sonographer:     Cristela Blue Referring  Phys:  [id number] Babara Buffalo Diagnosing Phys: Alwyn Pea MD  Sonographer Comments: Technically challenging study due to limited acoustic windows, no parasternal window and no subcostal window. Image acquisition challenging due to patient body habitus. IMPRESSIONS  1. LVH with bright speckled myocardium. Consider an infiltrative process (amyloid/sarcoid?).  2. Left ventricular ejection fraction, by estimation, is 55 to 60%. The left ventricle has normal function. The left ventricle has no regional wall motion abnormalities. There is moderate asymmetric left ventricular hypertrophy of the septal segment. Left ventricular diastolic parameters are consistent with Grade I diastolic dysfunction (impaired relaxation).  3. Right ventricular systolic function is normal. The right ventricular size is normal.  4. The mitral valve is normal in structure. Trivial mitral valve regurgitation.  5. The aortic valve is grossly normal. Aortic valve regurgitation is trivial. Aortic valve sclerosis is present, with no evidence of aortic valve stenosis. FINDINGS  Left Ventricle: Left ventricular ejection fraction, by estimation, is 55 to 60%. The left ventricle has normal function. The left ventricle has no regional wall motion abnormalities. The left ventricular internal cavity size was normal in size. There is  moderate asymmetric left ventricular hypertrophy of the septal segment. Left ventricular diastolic parameters are consistent with Grade I diastolic dysfunction (impaired relaxation). Right Ventricle: The right ventricular size is normal. No increase in right ventricular wall thickness. Right ventricular systolic function is normal. Left Atrium: Left atrial size was normal in size. Right Atrium: Right atrial size was normal in size. Pericardium: There is no evidence of pericardial effusion. Mitral Valve: The mitral valve is  normal in structure. Trivial mitral valve regurgitation. MV peak gradient, 4.2 mmHg. The mean  mitral valve gradient is 1.0 mmHg. Tricuspid Valve: The tricuspid valve is normal in structure. Tricuspid valve regurgitation is mild. Aortic Valve: The aortic valve is grossly normal. Aortic valve regurgitation is trivial. Aortic valve sclerosis is present, with no evidence of aortic valve stenosis. Aortic valve mean gradient measures 3.0 mmHg. Aortic valve peak gradient measures 4.8 mmHg. Aortic valve area, by VTI measures 3.48 cm. Pulmonic Valve: The pulmonic valve was normal in structure. Pulmonic valve regurgitation is not visualized. Aorta: The ascending aorta was not well visualized. IAS/Shunts: No atrial level shunt detected by color flow Doppler. Additional Comments: LVH with bright speckled myocardium. Consider an infiltrative process (amyloid/sarcoid?).  LEFT VENTRICLE PLAX 2D LVIDd:         3.90 cm   Diastology LVIDs:         2.80 cm   LV e' medial:    2.94 cm/s LV PW:         0.90 cm   LV E/e' medial:  20.1 LV IVS:        1.60 cm   LV e' lateral:   7.07 cm/s LVOT diam:     2.00 cm   LV E/e' lateral: 8.4 LV SV:         96 LV SV Index:   61 LVOT Area:     3.14 cm  RIGHT VENTRICLE RV Basal diam:  3.30 cm RV Mid diam:    2.50 cm RV S prime:     9.46 cm/s LEFT ATRIUM             Index        RIGHT ATRIUM           Index LA diam:        3.20 cm 2.01 cm/m   RA Area:     14.10 cm LA Vol (A2C):   28.9 ml 18.15 ml/m  RA Volume:   33.70 ml  21.17 ml/m LA Vol (A4C):   21.6 ml 13.57 ml/m LA Biplane Vol: 26.3 ml 16.52 ml/m  AORTIC VALVE AV Area (Vmax):    4.06 cm AV Area (Vmean):   4.18 cm AV Area (VTI):     3.48 cm AV Vmax:           109.00 cm/s AV Vmean:          76.700 cm/s AV VTI:            0.277 m AV Peak Grad:      4.8 mmHg AV Mean Grad:      3.0 mmHg LVOT Vmax:         141.00 cm/s LVOT Vmean:        102.000 cm/s LVOT VTI:          0.307 m LVOT/AV VTI ratio: 1.11  AORTA Ao Root diam: 2.40 cm MITRAL VALVE                TRICUSPID VALVE MV Area (PHT): 3.12 cm     TR Peak grad:   11.3 mmHg MV Area  VTI:   3.14 cm     TR Vmax:        168.00 cm/s MV Peak grad:  4.2 mmHg MV Mean grad:  1.0 mmHg     SHUNTS MV Vmax:       1.02 m/s     Systemic VTI:  0.31 m MV Vmean:  50.8 cm/s    Systemic Diam: 2.00 cm MV Decel Time: 243 msec MV E velocity: 59.20 cm/s MV A velocity: 112.00 cm/s MV E/A ratio:  0.53 Alwyn Pea MD Electronically signed by Alwyn Pea MD Signature Date/Time: 01/15/2023/4:27:25 PM    Final    DG Chest 2 View  Result Date: 01/14/2023 CLINICAL DATA:  Shortness of breath EXAM: CHEST - 2 VIEW COMPARISON:  None Available. FINDINGS: Hyperinflation. Chronic lung changes. No consolidation, pneumothorax or effusion. No edema. Calcified aorta. Normal cardiopericardial silhouette. Degenerative changes seen of the spine and shoulders. IMPRESSION: Hyperinflation.  Chronic changes.  No acute cardiopulmonary disease. Electronically Signed   By: Karen Kays M.D.   On: 01/14/2023 15:21    ECHO 01/15/2023:  1. LVH with bright speckled myocardium. Consider an infiltrative process (amyloid/sarcoid?).   2. Left ventricular ejection fraction, by estimation, is 55 to 60%. The left ventricle has normal function. The left ventricle has no regional wall motion abnormalities. There is moderate asymmetric left ventricular hypertrophy of the septal segment. Left ventricular diastolic parameters are consistent with Grade I diastolic dysfunction (impaired relaxation).   3. Right ventricular systolic function is normal. The right ventricular size is normal.   4. The mitral valve is normal in structure. Trivial mitral valve regurgitation.   5. The aortic valve is grossly normal. Aortic valve regurgitation is  trivial. Aortic valve sclerosis is present, with no evidence of aortic  valve stenosis.    TELEMETRY reviewed by me [facility]) 01/16/2023 : sinus bradycardia LBBB rate 50s  EKG reviewed by me: sinus bradycardia rate 48 bpm LBBB  Data reviewed by me [facility]) 01/16/2023: last 24h vitals tele labs imaging I/O ED  provider note, admission H&P  Principal Problem:   Syncope Active Problems:   Syncope and collapse   Bradycardia    ASSESSMENT AND PLAN:  Jerry Atkinson is a 89 y.o. male  with a past medical history of paroxysmal atrial fibrillation (eliquis), bradycardia, nonobstructive coronary artery disease, chronic LBBB, carotid artery stenosis, hypertension, hyperlipidemia, diabetes who presented to the ED on 01/14/2023 for syncope. Cardiology was consulted for further evaluation and management.   # Symptomatic bradycardia # Syncope Patient with episode of syncope yesterday with associated dizziness. Has had episodes of dizziness over the last year, this is the first episode of syncope he has experienced. Episode lasted less than one minute and was witnessed. Noted to be bradycardic on telemetry to the 40s. ?sinus pauses on tele, less than 3 seconds.  -Discussed the risks and benefits of proceeding with pacemaker implantation with the patient.  He is agreeable to proceed.  NPO until pacemaker implant this morning (01/16/2023) with Dr. Darrold Junker.  Written consent will be obtained.  -Keflex 500 mg bid x10 days -Continue to monitor HR closely.   # Paroxysmal atrial fibrillation Patient with hx of pAF on long-term anticoagulation with Eliquis. Last dose this AM. Currently in sinus on telemetry.  -Hold eliquis for pacemaker implant tomorrow. Will likely plan to restart Sunday.   # Coronary artery disease # Carotid artery stenosis # Hyperlipidemia # Hypertension Patient with history of remote LHC which showed nonobstructive CAD. CTA neck 2019 with significant carotid stenosis, previously followed by vascular surgery. Has been holding simvastatin for 3 weeks due to leg weakness but feels symptoms have not improved off this medication.  -Continue simvastatin. -Continue aspirin 81 mg daily.  -Continue home ramipril 10 mg daily.    This patient's plan of care was discussed and created with Dr.  Custovic and  she is in agreement.  Signed: Gale Journey, PA-C  01/16/2023, 10:36 AM [facility] Cardiology

## 2023-01-16 NOTE — Progress Notes (Signed)
[facility]  - [facility] [place] [facility]   PATIENT NAME: Jerry Atkinson    MR#:  [id number]  DATE OF BIRTH:  [birth year]  SUBJECTIVE:  patient son at bedside. Patient denies any complaints. He is NPO for pacemaker placement today.   VITALS:  Blood pressure (!) 156/79, pulse 60, temperature 98.7 F (37.1 C), temperature source Oral, resp. rate 16, height 5\' 5"  (1.651 m), weight 54.4 kg, SpO2 97%.  PHYSICAL EXAMINATION:   GENERAL:  89 y.o.-year-old patient with no acute distress.  LUNGS: Normal breath sounds bilaterally, no wheezing CARDIOVASCULAR: S1, S2 normal. No murmur HR 50's   ABDOMEN: Soft, nontender, nondistended. Bowel sounds present.  EXTREMITIES: No  edema b/l.    NEUROLOGIC: nonfocal  patient is alert and awake  LABORATORY PANEL:  CBC Recent Labs  Lab 01/16/23 0243  WBC 3.7*  HGB 14.7  HCT 43.6  PLT 138*    Chemistries  Recent Labs  Lab 01/15/23 0801 01/16/23 0243  NA 133* 135  K 4.3 4.0  CL 103 104  CO2 24 26  GLUCOSE 118* 110*  BUN 18 22  CREATININE 0.61 0.77  CALCIUM 8.7* 8.6*  MG 2.3  --    Cardiac Enzymes No results for input(s): "TROPONINI" in the last 168 hours. RADIOLOGY:  EP PPM/ICD IMPLANT  Result Date: 01/16/2023 Successful implantation of MRI compatible dual-chamber rate responsive pacemaker   ECHOCARDIOGRAM COMPLETE  Result Date: 01/15/2023    ECHOCARDIOGRAM REPORT   Patient Name:   Jerry Atkinson Date of Exam: 01/15/2023 Medical Rec #:  [id number]      Height:       65.0 in Accession #:    [id number]     Weight:       120.0 lb Date of Birth:  May 29, 1930      BSA:          1.592 m Patient Age:    89 years       BP:           128/65 mmHg Patient Gender: M              HR:           53 bpm. Exam Location:  [facility] Procedure: 2D Echo, Cardiac Doppler and Color Doppler Indications:     Mitral valve disorder, other non-rheumatic I34.2  History:         Patient has no prior history of Echocardiogram examinations.                  Risk  Factors:Diabetes and Hypertension.  Sonographer:     Cristela Blue Referring Phys:  [id number] CARALYN HUDSON Diagnosing Phys: Alwyn Pea MD  Sonographer Comments: Technically challenging study due to limited acoustic windows, no parasternal window and no subcostal window. Image acquisition challenging due to patient body habitus. IMPRESSIONS  1. LVH with bright speckled myocardium. Consider an infiltrative process (amyloid/sarcoid?).  2. Left ventricular ejection fraction, by estimation, is 55 to 60%. The left ventricle has normal function. The left ventricle has no regional wall motion abnormalities. There is moderate asymmetric left ventricular hypertrophy of the septal segment. Left ventricular diastolic parameters are consistent with Grade I diastolic dysfunction (impaired relaxation).  3. Right ventricular systolic function is normal. The right ventricular size is normal.  4. The mitral valve is normal in structure. Trivial mitral valve regurgitation.  5. The aortic valve is grossly normal. Aortic valve regurgitation is trivial. Aortic valve sclerosis is present, with no evidence  of aortic valve stenosis. FINDINGS  Left Ventricle: Left ventricular ejection fraction, by estimation, is 55 to 60%. The left ventricle has normal function. The left ventricle has no regional wall motion abnormalities. The left ventricular internal cavity size was normal in size. There is  moderate asymmetric left ventricular hypertrophy of the septal segment. Left ventricular diastolic parameters are consistent with Grade I diastolic dysfunction (impaired relaxation). Right Ventricle: The right ventricular size is normal. No increase in right ventricular wall thickness. Right ventricular systolic function is normal. Left Atrium: Left atrial size was normal in size. Right Atrium: Right atrial size was normal in size. Pericardium: There is no evidence of pericardial effusion. Mitral Valve: The mitral valve is normal in structure.  Trivial mitral valve regurgitation. MV peak gradient, 4.2 mmHg. The mean mitral valve gradient is 1.0 mmHg. Tricuspid Valve: The tricuspid valve is normal in structure. Tricuspid valve regurgitation is mild. Aortic Valve: The aortic valve is grossly normal. Aortic valve regurgitation is trivial. Aortic valve sclerosis is present, with no evidence of aortic valve stenosis. Aortic valve mean gradient measures 3.0 mmHg. Aortic valve peak gradient measures 4.8 mmHg. Aortic valve area, by VTI measures 3.48 cm. Pulmonic Valve: The pulmonic valve was normal in structure. Pulmonic valve regurgitation is not visualized. Aorta: The ascending aorta was not well visualized. IAS/Shunts: No atrial level shunt detected by color flow Doppler. Additional Comments: LVH with bright speckled myocardium. Consider an infiltrative process (amyloid/sarcoid?).  LEFT VENTRICLE PLAX 2D LVIDd:         3.90 cm   Diastology LVIDs:         2.80 cm   LV e' medial:    2.94 cm/s LV PW:         0.90 cm   LV E/e' medial:  20.1 LV IVS:        1.60 cm   LV e' lateral:   7.07 cm/s LVOT diam:     2.00 cm   LV E/e' lateral: 8.4 LV SV:         96 LV SV Index:   61 LVOT Area:     3.14 cm  RIGHT VENTRICLE RV Basal diam:  3.30 cm RV Mid diam:    2.50 cm RV S prime:     9.46 cm/s LEFT ATRIUM             Index        RIGHT ATRIUM           Index LA diam:        3.20 cm 2.01 cm/m   RA Area:     14.10 cm LA Vol (A2C):   28.9 ml 18.15 ml/m  RA Volume:   33.70 ml  21.17 ml/m LA Vol (A4C):   21.6 ml 13.57 ml/m LA Biplane Vol: 26.3 ml 16.52 ml/m  AORTIC VALVE AV Area (Vmax):    4.06 cm AV Area (Vmean):   4.18 cm AV Area (VTI):     3.48 cm AV Vmax:           109.00 cm/s AV Vmean:          76.700 cm/s AV VTI:            0.277 m AV Peak Grad:      4.8 mmHg AV Mean Grad:      3.0 mmHg LVOT Vmax:         141.00 cm/s LVOT Vmean:        102.000 cm/s LVOT VTI:  0.307 m LVOT/AV VTI ratio: 1.11  AORTA Ao Root diam: 2.40 cm MITRAL VALVE                 TRICUSPID VALVE MV Area (PHT): 3.12 cm     TR Peak grad:   11.3 mmHg MV Area VTI:   3.14 cm     TR Vmax:        168.00 cm/s MV Peak grad:  4.2 mmHg MV Mean grad:  1.0 mmHg     SHUNTS MV Vmax:       1.02 m/s     Systemic VTI:  0.31 m MV Vmean:      50.8 cm/s    Systemic Diam: 2.00 cm MV Decel Time: 243 msec MV E velocity: 59.20 cm/s MV A velocity: 112.00 cm/s MV E/A ratio:  0.53 Jerry D Callwood MD Electronically signed by Alwyn Pea MD Signature Date/Time: 01/15/2023/4:27:25 PM    Final    DG Chest 2 View  Result Date: 01/14/2023 CLINICAL DATA:  Shortness of breath EXAM: CHEST - 2 VIEW COMPARISON:  None Available. FINDINGS: Hyperinflation. Chronic lung changes. No consolidation, pneumothorax or effusion. No edema. Calcified aorta. Normal cardiopericardial silhouette. Degenerative changes seen of the spine and shoulders. IMPRESSION: Hyperinflation.  Chronic changes.  No acute cardiopulmonary disease. Electronically Signed   By: Karen Kays M.D.   On: 01/14/2023 15:21    Assessment and Plan  Jerry Atkinson is a 89 y.o. male with medical history significant of PAF on Eliquis, sinus bradycardia, LBBB, IIDM, HTN, basal cell carcinoma s/p radiation therapy, brought him in by family member for evaluation for syncope.   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  Patient remember he suddenly felt lightheaded then lost consciousness.  Episode lasted less than 1 minute and patient resumed consciousness himself with short period of confusion afterwards He went to see cardiology about 2 weeks ago, found the patient in bradycardia and discussed about close follow-up in the future and cardiology discontinue simvastatin at same visit   In the ER, Bradycardia heart rate 40-50s, blood pressure 150/75 nonhypoxic.  EKG showed sinus bradycardia.   Syncope  symptomatic bradycardia/Chronic LBBB -- patient seen by [facility] cardiology.  Discussion was made with patient and family regarding a permanent pacemaker placement. Patient agreeable with it. -- Blood pressure stable. Currently not having any symptoms.   HTN -Stable, continue ACEI   DM--2 w/o complication -Glucose level stable, continue glimepiride   PAF chronic anticoagulation with eliquis -Sinus rhythm -- hold eliquis as per cardiology request given pacemaker placement schedule tomorrow   DVT prophylaxis: SCD. Eliquis on hold Code Status: Full code Family Communication:none today Disposition Plan: home in 1-2 days Level of care: Telemetry Cardiac Status is: Inpatient Remains inpatient appropriate because: symptomatic bradycardia. Patient scheduled to get pacemaker today    TOTAL TIME TAKING CARE OF THIS PATIENT: 35 minutes.  >50% time spent on counselling and coordination of care  Note: This dictation was prepared with Dragon dictation along with smaller phrase technology. Any transcriptional errors that result from this process are unintentional.  Enedina Finner M.D    [facility]   CC: Primary care physician; [facility]

## 2023-01-16 NOTE — Discharge Instructions (Addendum)
For 6 weeks, avoid lifting greater than 15 pounds or raising your left arm above your head. Please do not shower until tomorrow and do not submerge your left chest in water (no baths, swimming) for at least 1 week or until you follow up with Dr. Eusebio Friendly can remove the clear bandage on your left chest if it starts to peel off, but please do NOT take off the steri strips underneath (thin rectangular strips). Take all your medicines as prescribed, including the antibiotic I have prescribed called Keflex (cefalexin). Please call Dr. Juliann Pares 's office at [facility] [phone]) or if you have any questions or concerns.    Start taking your Eliquis from 01/17/23 (Sunday)

## 2023-01-17 DIAGNOSIS — R55 Syncope and collapse: Secondary | ICD-10-CM

## 2023-01-17 DIAGNOSIS — I480 Paroxysmal atrial fibrillation: Secondary | ICD-10-CM

## 2023-01-17 DIAGNOSIS — R001 Bradycardia, unspecified: Secondary | ICD-10-CM | POA: Diagnosis not present

## 2023-01-17 LAB — GLUCOSE, CAPILLARY
Glucose-Capillary: 119 mg/dL — ABNORMAL HIGH (ref 70–99)
Glucose-Capillary: 123 mg/dL — ABNORMAL HIGH (ref 70–99)

## 2023-01-17 LAB — CBC
HCT: 43.9 % (ref 39.0–52.0)
Hemoglobin: 15.1 g/dL (ref 13.0–17.0)
MCH: 32.3 pg (ref 26.0–34.0)
MCHC: 34.4 g/dL (ref 30.0–36.0)
MCV: 94 fL (ref 80.0–100.0)
Platelets: 126 10*3/uL — ABNORMAL LOW (ref 150–400)
RBC: 4.67 MIL/uL (ref 4.22–5.81)
RDW: 13.3 % (ref 11.5–15.5)
WBC: 4.8 10*3/uL (ref 4.0–10.5)
nRBC: 0 % (ref 0.0–0.2)

## 2023-01-17 LAB — BASIC METABOLIC PANEL
Anion gap: 8 (ref 5–15)
BUN: 20 mg/dL (ref 8–23)
CO2: 24 mmol/L (ref 22–32)
Calcium: 8.6 mg/dL — ABNORMAL LOW (ref 8.9–10.3)
Chloride: 104 mmol/L (ref 98–111)
Creatinine, Ser: 0.69 mg/dL (ref 0.61–1.24)
GFR, Estimated: >60 mL/min (ref >60)
Glucose, Bld: 110 mg/dL — ABNORMAL HIGH (ref 70–99)
Potassium: 4.3 mmol/L (ref 3.5–5.1)
Sodium: 136 mmol/L (ref 135–145)

## 2023-01-17 MED ORDER — CEPHALEXIN 500 MG PO CAPS: 500.0000 mg | ORAL_CAPSULE | Freq: Two times a day (BID) | ORAL | 0 refills | Status: AC

## 2023-01-17 NOTE — ED Provider Notes (Signed)
[facility]    CSN: [id number] Arrival date & time: 01/14/23  1245      History   Chief Complaint No chief complaint on file.   HPI Jerry Atkinson is a 89 y.o. male.   HPI  History provided by patient and his son Patient seen in triage.  July presents for dizziness, syncope and collapse.  Son states that he was talking with him then he lost consciousness and just fell over onto him but then woke up a few seconds later.  Patient notes that he has been having some palpitations denies chest pain but endorses shortness of breath.  Denies missing any doses of his Eliquis.  Denies hitting his head.  No seizure-like activity and no incontinence occurred.  Recently saw his cardiologist with talks about getting a pacemaker.      Past Medical History:  Diagnosis Date   Actinic keratosis    Basal cell carcinoma 04/09/2020    R ant alar crease, EDC 06/18/20   BPH (benign prostatic hypertrophy)    Diabetes mellitus, type 2 (HCC)    GERD (gastroesophageal reflux disease)    Hypercholesteremia    Hypertension    Wears dentures    full upper   Wears hearing aid in both ears     Patient Active Problem List   Diagnosis Date Noted   Syncope and collapse 01/14/2023   Bradycardia 01/14/2023   Syncope 01/14/2023   PAF (paroxysmal atrial fibrillation) (HCC) 02/01/2017   Carotid atherosclerosis, bilateral 12/23/2016   Benign localized hyperplasia of prostate with urinary obstruction 11/10/2016   History of nephrolithiasis 11/10/2016   Incomplete emptying of bladder 11/10/2016   Urge incontinence 11/10/2016   Primary osteoarthritis of both knees 02/04/2016   B12 deficiency 09/06/2015   Diabetic polyneuropathy associated with type 2 diabetes mellitus (HCC) 09/06/2015   H/O myasthenia gravis 09/06/2015   Right leg weakness 09/06/2015   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   High risk medication use 07/30/2014   Status post total right knee replacement 03/12/2014   Benign essential  hypertension 02/02/2014   Benign prostatic hyperplasia 02/02/2014   GERD (gastroesophageal reflux disease) 02/02/2014   Hyperlipidemia associated with type 2 diabetes mellitus (HCC) 02/02/2014   Perennial allergic rhinitis 02/02/2014   Type 2 diabetes mellitus with sensory neuropathy (HCC) 02/02/2014    Past Surgical History:  Procedure Laterality Date   CATARACT EXTRACTION W/PHACO Right 09/10/2015   Procedure: CATARACT EXTRACTION PHACO AND INTRAOCULAR LENS PLACEMENT (IOC);  Surgeon: Sherald Hess, MD;  Location: [facility];  Service: Ophthalmology;  Laterality: Right;  LEAVE MARTINS TOGETHER   CATARACT EXTRACTION W/PHACO Left 07/01/2021   Procedure: CATARACT EXTRACTION PHACO AND INTRAOCULAR LENS PLACEMENT (IOC) LEFT DIABETIC;  Surgeon: Nevada Crane, MD;  Location: [facility];  Service: Ophthalmology;  Laterality: Left;  Diabetic 7.49 00:53.9   PACEMAKER IMPLANT N/A 01/16/2023   Procedure: PACEMAKER IMPLANT;  Surgeon: Marcina Millard, MD;  Location: [facility];  Service: Cardiovascular;  Laterality: N/A;   REPLACEMENT TOTAL KNEE         Home Medications    Prior to Admission medications   Medication Sig Start Date End Date Taking? Authorizing Provider  acetaminophen (TYLENOL) 325 MG tablet Take by mouth.    [provider]  ascorbic acid (VITAMIN C) 500 MG tablet Take 500 mg by mouth daily.    [provider]  aspirin 81 MG tablet Take 81 mg by mouth daily.    [provider]  Blood  Glucose Calibration (ACCU-CHEK GUIDE CONTROL) LIQD  06/30/17   [provider]  cephALEXin (KEFLEX) 500 MG capsule Take 1 capsule (500 mg total) by mouth 2 (two) times daily for 20 doses. 01/17/23 01/27/23  Enedina Finner, MD  Cholecalciferol (VITAMIN D3) 50 MCG (2000 UT) capsule Take 2,000 Units by mouth daily.    [provider]  cyanocobalamin 1000 MCG tablet Take 1,000 mcg by mouth daily.    [provider]   ELIQUIS 5 MG TABS tablet 5 mg 2 (two) times daily. 09/14/18   [provider]  finasteride (PROSCAR) 5 MG tablet Take 5 mg by mouth daily.    [provider]  gabapentin (NEURONTIN) 100 MG capsule Take 200 mg by mouth 2 (two) times daily.    [provider]  glimepiride (AMARYL) 1 MG tablet Take 1 mg by mouth daily.    [provider]  Multiple Vitamins-Minerals (PRESERVISION AREDS 2 PO) Take 1 capsule by mouth daily.    [provider]  oxybutynin (DITROPAN-XL) 10 MG 24 hr tablet Take 10 mg by mouth daily.    [provider]  ramipril (ALTACE) 10 MG capsule Take by mouth. 02/04/18   [provider]  sertraline (ZOLOFT) 50 MG tablet Take 1 tablet by mouth daily. 12/11/22 12/11/23  [provider]  simvastatin (ZOCOR) 20 MG tablet Take 20 mg by mouth daily. Patient not taking: Reported on 01/14/2023    [provider]  tamsulosin (FLOMAX) 0.4 MG CAPS capsule Take 0.8 mg by mouth daily. 07/21/17   [provider]    Family History No family history on file.  Social History Social History   Tobacco Use   Smoking status: Never   Smokeless tobacco: Never  Vaping Use   Vaping status: Never Used  Substance Use Topics   Alcohol use: No     Allergies   Patient has no known allergies.   Review of Systems Review of Systems: negative unless otherwise stated in HPI.      Physical Exam Triage Vital Signs ED Triage Vitals  Encounter Vitals Group     BP      Systolic BP Percentile      Diastolic BP Percentile      Pulse      Resp      Temp      Temp src      SpO2      Weight      Height      Head Circumference      Peak Flow      Pain Score      Pain Loc      Pain Education      Exclude from Growth Chart    No data found.  Updated Vital Signs There were no vitals taken for this visit.  Visual Acuity Right Eye Distance:   Left Eye Distance:   Bilateral Distance:    Right Eye Near:    Left Eye Near:    Bilateral Near:     Physical Exam GEN:     alert, elderly male and no distress    HENT:  mucus membranes moist, no visible hematomas EYES:   pupils equal and reactive RESP:  clear to auscultation bilaterally, no increased work of breathing  CVS:  Sinus bradycardia, no appreciable murmurs. NEURO: Alert, normal speech, cranial nerves II through XII grossly intact SKIN:   warm and dry    UC Treatments / Results  Labs (all labs ordered  are listed, but only abnormal results are displayed) Labs Reviewed  GLUCOSE, CAPILLARY - Abnormal; Notable for the following components:      Result Value   Glucose-Capillary 190 (*)    All other components within normal limits  CBG MONITORING, ED    EKG  If EKG performed, see my interpretation in the MDM section  Radiology    Procedures Procedures (including critical care time)  Medications Ordered in UC Medications - No data to display  Initial Impression / Assessment and Plan / UC Course  I have reviewed the triage vital signs and the nursing notes.  Pertinent labs & imaging results that were available during my care of the patient were reviewed by me and considered in my medical decision making (see chart for details).       Patient is a 89 y.o. male history of paroxysmal atrial fibrillation, non-insulin-dependent diabetes, hypertension who presents after a syncopal episode today.  Overall patient is nontoxic-appearing and afebrile.  He is bradycardic.  Has history of A-fib.  EKG obtained showing sinus bradycardia with PACs and LBBB.  CBG obtained and it was 190.    He has some significant risk factors for blood clots and cardiac etiologies causing his syncope.  I am most concerned about his bradycardia.  Recommended ED evaluation and they are agreeable.  Son feels comfortable driving him to the emergency department.  His vital signs were stable though not recorded or stable.  [facility] emergency department to let  them and triage nurse know if there are arrival by private vehicle.  They will await his arrival.  ED and return precautions given and patient/guardian voiced understanding. Discussed MDM, treatment plan and plan for follow-up with patient who agrees with plan.     Final Clinical Impressions(s) / UC Diagnoses   Final diagnoses:  Syncope, unspecified syncope type  Palpitations     Discharge Instructions      You have been advised to follow up immediately in the emergency department for concerning signs or symptoms as discussed during your visit. If you declined EMS transport, please have a family member take you directly to the ED at this time. Do not delay.   Based on concerns about condition, if you do not follow up in the ED, you may risk poor outcomes including worsening of condition, delayed treatment and potentially life threatening issues. If you have declined to go to the ED at this time, you should call your PCP immediately to set up a follow up appointment     ED Prescriptions   None    PDMP not reviewed this encounter.   Katha Cabal, DO 01/17/23 1655

## 2023-01-17 NOTE — Plan of Care (Signed)
  Problem: Education: Goal: Ability to describe self-care measures that may prevent or decrease complications (Diabetes Survival Skills Education) will improve Outcome: Progressing Goal: Individualized Educational Video(s) Outcome: Progressing   Problem: Coping: Goal: Ability to adjust to condition or change in health will improve Outcome: Progressing   Problem: Fluid Volume: Goal: Ability to maintain a balanced intake and output will improve Outcome: Progressing   Problem: Health Behavior/Discharge Planning: Goal: Ability to identify and utilize available resources and services will improve Outcome: Progressing Goal: Ability to manage health-related needs will improve Outcome: Progressing   Problem: Metabolic: Goal: Ability to maintain appropriate glucose levels will improve Outcome: Progressing   Problem: Nutritional: Goal: Maintenance of adequate nutrition will improve Outcome: Progressing Goal: Progress toward achieving an optimal weight will improve Outcome: Progressing   Problem: Skin Integrity: Goal: Risk for impaired skin integrity will decrease Outcome: Progressing   Problem: Tissue Perfusion: Goal: Adequacy of tissue perfusion will improve Outcome: Progressing   Problem: Education: Goal: Knowledge of condition and prescribed therapy will improve Outcome: Progressing   Problem: Cardiac: Goal: Will achieve and/or maintain adequate cardiac output Outcome: Progressing   Problem: Physical Regulation: Goal: Complications related to the disease process, condition or treatment will be avoided or minimized Outcome: Progressing   Problem: Education: Goal: Knowledge of cardiac device and self-care will improve Outcome: Progressing Goal: Ability to safely manage health related needs after discharge will improve Outcome: Progressing Goal: Individualized Educational Video(s) Outcome: Progressing   Problem: Cardiac: Goal: Ability to achieve and maintain adequate  cardiopulmonary perfusion will improve Outcome: Progressing   Problem: Education: Goal: Knowledge of General Education information will improve Description: Including pain rating scale, medication(s)/side effects and non-pharmacologic comfort measures Outcome: Progressing   Problem: Health Behavior/Discharge Planning: Goal: Ability to manage health-related needs will improve Outcome: Progressing   Problem: Clinical Measurements: Goal: Ability to maintain clinical measurements within normal limits will improve Outcome: Progressing Goal: Will remain free from infection Outcome: Progressing Goal: Diagnostic test results will improve Outcome: Progressing Goal: Respiratory complications will improve Outcome: Progressing Goal: Cardiovascular complication will be avoided Outcome: Progressing   Problem: Activity: Goal: Risk for activity intolerance will decrease Outcome: Progressing   Problem: Nutrition: Goal: Adequate nutrition will be maintained Outcome: Progressing   Problem: Coping: Goal: Level of anxiety will decrease Outcome: Progressing   Problem: Elimination: Goal: Will not experience complications related to bowel motility Outcome: Progressing Goal: Will not experience complications related to urinary retention Outcome: Progressing   Problem: Pain Managment: Goal: General experience of comfort will improve Outcome: Progressing   Problem: Safety: Goal: Ability to remain free from injury will improve Outcome: Progressing   Problem: Skin Integrity: Goal: Risk for impaired skin integrity will decrease Outcome: Progressing

## 2023-01-17 NOTE — Plan of Care (Signed)
  Problem: Education: Goal: Knowledge of General Education information will improve Description Including pain rating scale, medication(s)/side effects and non-pharmacologic comfort measures Outcome: Progressing   

## 2023-01-17 NOTE — Discharge Summary (Addendum)
Physician Discharge Summary   Patient: Jerry Atkinson MRN: [id number] DOB: 1930/09/03  Admit date:     01/14/2023  Discharge date: 01/17/23  Discharge Physician: Enedina Finner   PCP: Marina Goodell, MD   Recommendations at discharge:    F/u Dr Juliann Pares in 1-2 weeks F/u PCP in 1 week Start taking your Eliquis from 01/17/23  Discharge Diagnoses: Principal Problem:   Syncope Active Problems:   PAF (paroxysmal atrial fibrillation) (HCC)   Syncope and collapse   Bradycardia   Jerry Atkinson is a 89 y.o. male with medical history significant of PAF on Eliquis, sinus bradycardia, LBBB, IIDM, HTN, basal cell carcinoma s/p radiation therapy, brought him in by family member for evaluation for syncope.   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  Patient remember he suddenly felt lightheaded then lost consciousness.  Episode lasted less than 1 minute and patient resumed consciousness himself with short period of confusion afterwards He went to see cardiology about 2 weeks ago, found the patient in bradycardia and discussed about close follow-up in the future and cardiology discontinue simvastatin at same visit    In the ER, Bradycardia heart rate 40-50s, blood pressure 150/75 nonhypoxic.  EKG showed sinus bradycardia.    Syncope  symptomatic bradycardia/Chronic LBBB -- patient seen by [facility] cardiology. Discussion was made with patient and family regarding a permanent pacemaker placement. Patient agreeable with it. -- Blood pressure stable. Currently not having any symptoms. --s/p PM placement. Po keflex for 10 days per cardiology. 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 Eliquis on hold Code Status: Full code Family Communication: son [name] on the phone Disposition Plan: home    Pain control - [place] Controlled Substance Reporting System database was reviewed. and patient was instructed, not to drive, operate heavy machinery, perform activities at heights, swimming or participation in water activities or provide baby-sitting services while on Pain, Sleep and Anxiety Medications; until their outpatient Physician has advised to do so again. Also recommended to not to take more than prescribed Pain, Sleep and Anxiety Medications.  Consultants: [facility] cardiology Procedures performed: PM placement  Diet recommendation:  Discharge Diet Orders (From admission, onward)     Start     Ordered   01/17/23 0000  Diet - low sodium heart healthy        01/17/23 1009            DISCHARGE MEDICATION: Allergies as of 01/17/2023   No Known Allergies      Medication List     TAKE these medications    Accu-Chek Guide Control Liqd   acetaminophen 325 MG tablet Commonly known as: TYLENOL Take by mouth.   ascorbic acid 500 MG tablet Commonly known as: VITAMIN C Take 500 mg by mouth daily.   aspirin 81 MG tablet Take 81 mg by mouth daily.   cephALEXin 500 MG capsule Commonly known as: KEFLEX Take 1 capsule (500 mg total) by mouth 2 (two) times daily for 20 doses.   cyanocobalamin 1000  MCG tablet Take 1,000 mcg by mouth daily.   Eliquis 5 MG Tabs tablet Generic drug: apixaban 5 mg 2 (two) times daily. Notes to patient: START TAKING FROM 01/18/2023   finasteride 5 MG tablet Commonly known as: PROSCAR Take 5 mg by mouth daily.   gabapentin 100 MG capsule Commonly known as: NEURONTIN Take 200 mg by mouth 2 (two) times daily.   glimepiride 1 MG tablet Commonly known as: AMARYL Take 1  mg by mouth daily.   oxybutynin 10 MG 24 hr tablet Commonly known as: DITROPAN-XL Take 10 mg by mouth daily.   PRESERVISION AREDS 2 PO Take 1 capsule by mouth daily.   ramipril 10 MG capsule Commonly known as: ALTACE Take by mouth.   sertraline 50 MG tablet Commonly known as: ZOLOFT Take 1 tablet by mouth daily.   simvastatin 20 MG tablet Commonly known as: ZOCOR Take 20 mg by mouth daily.   tamsulosin 0.4 MG Caps capsule Commonly known as: FLOMAX Take 0.8 mg by mouth daily.   Vitamin D3 50 MCG (2000 UT) capsule Take 2,000 Units by mouth daily.        Follow-up Information     Alwyn Pea, MD. Go in 1 week(s).   Specialties: Cardiology, Internal Medicine Contact information: [address] [place] [address] [phone]         Marina Goodell, MD. Schedule an appointment as soon as possible for a visit in 1 week(s).   Specialty: Family Medicine Contact information: [address] [place] [address] [phone]                Discharge Exam: Filed Weights   01/14/23 1352 01/17/23 0509  Weight: 54.4 kg 55.5 kg  GENERAL:  89 y.o.-year-old patient with no acute distress.  LUNGS: Normal breath sounds bilaterally, no wheezing CARDIOVASCULAR: S1, S2 normal. No murmur PM+ dressing intact ABDOMEN: Soft, nontender, nondistended. Bowel sounds present.  EXTREMITIES: No  edema b/l.    NEUROLOGIC: nonfocal  patient is alert and awake  Condition at discharge: fair  The results of significant diagnostics from this hospitalization (including imaging, microbiology, ancillary and laboratory) are listed below for reference.   Imaging Studies: EP PPM/ICD IMPLANT  Result Date: 01/16/2023 Successful implantation of MRI compatible dual-chamber rate responsive pacemaker   ECHOCARDIOGRAM COMPLETE  Result Date: 01/15/2023    ECHOCARDIOGRAM REPORT   Patient Name:   Jerry Atkinson Date of Exam: 01/15/2023 Medical Rec #:  [id number]      Height:        65.0 in Accession #:    [id number]     Weight:       120.0 lb Date of Birth:  06/30/1930      BSA:          1.592 m Patient Age:    89 years       BP:           128/65 mmHg Patient Gender: M              HR:           53 bpm. Exam Location:  [facility] Procedure: 2D Echo, Cardiac Doppler and Color Doppler Indications:     Mitral valve disorder, other non-rheumatic I34.2  History:         Patient has no prior history of Echocardiogram examinations.                  Risk Factors:Diabetes and Hypertension.  Sonographer:  Cristela Blue Referring Phys:  [id number] CARALYN HUDSON Diagnosing Phys: Alwyn Pea MD  Sonographer Comments: Technically challenging study due to limited acoustic windows, no parasternal window and no subcostal window. Image acquisition challenging due to patient body habitus. IMPRESSIONS  1. LVH with bright speckled myocardium. Consider an infiltrative process (amyloid/sarcoid?).  2. Left ventricular ejection fraction, by estimation, is 55 to 60%. The left ventricle has normal function. The left ventricle has no regional wall motion abnormalities. There is moderate asymmetric left ventricular hypertrophy of the septal segment. Left ventricular diastolic parameters are consistent with Grade I diastolic dysfunction (impaired relaxation).  3. Right ventricular systolic function is normal. The right ventricular size is normal.  4. The mitral valve is normal in structure. Trivial mitral valve regurgitation.  5. The aortic valve is grossly normal. Aortic valve regurgitation is trivial. Aortic valve sclerosis is present, with no evidence of aortic valve stenosis. FINDINGS  Left Ventricle: Left ventricular ejection fraction, by estimation, is 55 to 60%. The left ventricle has normal function. The left ventricle has no regional wall motion abnormalities. The left ventricular internal cavity size was normal in size. There is  moderate asymmetric left ventricular hypertrophy of the septal segment. Left  ventricular diastolic parameters are consistent with Grade I diastolic dysfunction (impaired relaxation). Right Ventricle: The right ventricular size is normal. No increase in right ventricular wall thickness. Right ventricular systolic function is normal. Left Atrium: Left atrial size was normal in size. Right Atrium: Right atrial size was normal in size. Pericardium: There is no evidence of pericardial effusion. Mitral Valve: The mitral valve is normal in structure. Trivial mitral valve regurgitation. MV peak gradient, 4.2 mmHg. The mean mitral valve gradient is 1.0 mmHg. Tricuspid Valve: The tricuspid valve is normal in structure. Tricuspid valve regurgitation is mild. Aortic Valve: The aortic valve is grossly normal. Aortic valve regurgitation is trivial. Aortic valve sclerosis is present, with no evidence of aortic valve stenosis. Aortic valve mean gradient measures 3.0 mmHg. Aortic valve peak gradient measures 4.8 mmHg. Aortic valve area, by VTI measures 3.48 cm. Pulmonic Valve: The pulmonic valve was normal in structure. Pulmonic valve regurgitation is not visualized. Aorta: The ascending aorta was not well visualized. IAS/Shunts: No atrial level shunt detected by color flow Doppler. Additional Comments: LVH with bright speckled myocardium. Consider an infiltrative process (amyloid/sarcoid?).  LEFT VENTRICLE PLAX 2D LVIDd:         3.90 cm   Diastology LVIDs:         2.80 cm   LV e' medial:    2.94 cm/s LV PW:         0.90 cm   LV E/e' medial:  20.1 LV IVS:        1.60 cm   LV e' lateral:   7.07 cm/s LVOT diam:     2.00 cm   LV E/e' lateral: 8.4 LV SV:         96 LV SV Index:   61 LVOT Area:     3.14 cm  RIGHT VENTRICLE RV Basal diam:  3.30 cm RV Mid diam:    2.50 cm RV S prime:     9.46 cm/s LEFT ATRIUM             Index        RIGHT ATRIUM           Index LA diam:        3.20 cm 2.01 cm/m   RA Area:  14.10 cm LA Vol (A2C):   28.9 ml 18.15 ml/m  RA Volume:   33.70 ml  21.17 ml/m LA Vol (A4C):   21.6  ml 13.57 ml/m LA Biplane Vol: 26.3 ml 16.52 ml/m  AORTIC VALVE AV Area (Vmax):    4.06 cm AV Area (Vmean):   4.18 cm AV Area (VTI):     3.48 cm AV Vmax:           109.00 cm/s AV Vmean:          76.700 cm/s AV VTI:            0.277 m AV Peak Grad:      4.8 mmHg AV Mean Grad:      3.0 mmHg LVOT Vmax:         141.00 cm/s LVOT Vmean:        102.000 cm/s LVOT VTI:          0.307 m LVOT/AV VTI ratio: 1.11  AORTA Ao Root diam: 2.40 cm MITRAL VALVE                TRICUSPID VALVE MV Area (PHT): 3.12 cm     TR Peak grad:   11.3 mmHg MV Area VTI:   3.14 cm     TR Vmax:        168.00 cm/s MV Peak grad:  4.2 mmHg MV Mean grad:  1.0 mmHg     SHUNTS MV Vmax:       1.02 m/s     Systemic VTI:  0.31 m MV Vmean:      50.8 cm/s    Systemic Diam: 2.00 cm MV Decel Time: 243 msec MV E velocity: 59.20 cm/s MV A velocity: 112.00 cm/s MV E/A ratio:  0.53 Dwayne D Callwood MD Electronically signed by Alwyn Pea MD Signature Date/Time: 01/15/2023/4:27:25 PM    Final    DG Chest 2 View  Result Date: 01/14/2023 CLINICAL DATA:  Shortness of breath EXAM: CHEST - 2 VIEW COMPARISON:  None Available. FINDINGS: Hyperinflation. Chronic lung changes. No consolidation, pneumothorax or effusion. No edema. Calcified aorta. Normal cardiopericardial silhouette. Degenerative changes seen of the spine and shoulders. IMPRESSION: Hyperinflation.  Chronic changes.  No acute cardiopulmonary disease. Electronically Signed   By: Karen Kays M.D.   On: 01/14/2023 15:21    Microbiology: Results for orders placed or performed during the hospital encounter of 01/14/23  Surgical PCR screen     Status: None   Collection Time: 01/16/23  5:00 AM   Specimen: Nasal Mucosa; Nasal Swab  Result Value Ref Range Status   MRSA, PCR NEGATIVE NEGATIVE Final   Staphylococcus aureus NEGATIVE NEGATIVE Final    Comment: (NOTE) The Xpert SA Assay (FDA approved for NASAL specimens in patients 49 years of age and older), is one component of a  comprehensive surveillance program. It is not intended to diagnose infection nor to guide or monitor treatment. Performed at [facility], [address] [place], [place] [address]     Labs: CBC: Recent Labs  Lab 01/14/23 1355 01/15/23 0801 01/16/23 0243 01/17/23 0700  WBC 4.5 3.8* 3.7* 4.8  HGB 14.7 14.9 14.7 15.1  HCT 44.5 45.6 43.6 43.9  MCV 98.2 97.6 96.2 94.0  PLT 142* 132* 138* 126*   Basic Metabolic Panel: Recent Labs  Lab 01/14/23 1355 01/15/23 0801 01/16/23 0243 01/17/23 0700  NA 136 133* 135 136  K 4.6 4.3 4.0 4.3  CL 103 103 104 104  CO2 26  24 26 24   GLUCOSE 129* 118* 110* 110*  BUN 28* 18 22 20   CREATININE 0.82 0.61 0.77 0.69  CALCIUM 9.0 8.7* 8.6* 8.6*  MG 2.6* 2.3  --   --    Liver Function Tests: No results for input(s): "AST", "ALT", "ALKPHOS", "BILITOT", "PROT", "ALBUMIN" in the last 168 hours. CBG: Recent Labs  Lab 01/16/23 1529 01/16/23 1722 01/16/23 2150 01/17/23 0510 01/17/23 0842  GLUCAP 272* 138* 115* 119* 123*    Discharge time spent: greater than 30 minutes.  Signed: Enedina Finner, MD [facility] 01/17/2023

## 2023-01-17 NOTE — Progress Notes (Signed)
[facility] CLINIC CARDIOLOGY CONSULT NOTE       Patient ID: Jerry Atkinson MRN: [id number] DOB/AGE: Sep 20, 1930 89 y.o.  Admit date: 01/14/2023 Referring Physician Dr. Mikey College Primary Physician Laren Everts, NP Primary Cardiologist Dr. Juliann Pares Reason for Consultation syncope  HPI: Jerry Atkinson is a 89 y.o. male  with a past medical history of paroxysmal atrial fibrillation (eliquis), bradycardia, nonobstructive coronary artery disease, chronic LBBB, carotid artery stenosis, hypertension, hyperlipidemia, diabetes who presented to the ED on 01/14/2023 for syncope. Cardiology was consulted for further evaluation and management.   Patient seen and examined at bedside, resting comfortably. No events overnight. He is feeling well and ready to go home.    Review of systems complete and found to be negative unless listed above    Past Medical History:  Diagnosis Date   Actinic keratosis    Basal cell carcinoma 04/09/2020    R ant alar crease, EDC 06/18/20   BPH (benign prostatic hypertrophy)    Diabetes mellitus, type 2 (HCC)    GERD (gastroesophageal reflux disease)    Hypercholesteremia    Hypertension    Wears dentures    full upper   Wears hearing aid in both ears     Past Surgical History:  Procedure Laterality Date   CATARACT EXTRACTION W/PHACO Right 09/10/2015   Procedure: CATARACT EXTRACTION PHACO AND INTRAOCULAR LENS PLACEMENT (IOC);  Surgeon: Sherald Hess, MD;  Location: [facility];  Service: Ophthalmology;  Laterality: Right;  LEAVE MARTINS TOGETHER   CATARACT EXTRACTION W/PHACO Left 07/01/2021   Procedure: CATARACT EXTRACTION PHACO AND INTRAOCULAR LENS PLACEMENT (IOC) LEFT DIABETIC;  Surgeon: Nevada Crane, MD;  Location: [facility];  Service: Ophthalmology;  Laterality: Left;  Diabetic 7.49 00:53.9   PACEMAKER IMPLANT N/A 01/16/2023   Procedure: PACEMAKER IMPLANT;  Surgeon: Marcina Millard, MD;  Location: [facility] INVASIVE CV LAB;   Service: Cardiovascular;  Laterality: N/A;   REPLACEMENT TOTAL KNEE      No medications prior to admission.   Social History   Socioeconomic History   Marital status: Married    Spouse name: Not on file   Number of children: Not on file   Years of education: Not on file   Highest education level: Not on file  Occupational History   Not on file  Tobacco Use   Smoking status: Never   Smokeless tobacco: Never  Vaping Use   Vaping status: Never Used  Substance and Sexual Activity   Alcohol use: No   Drug use: Not on file   Sexual activity: Not on file  Other Topics Concern   Not on file  Social History Narrative   Not on file   Social Determinants of Health   Financial Resource Strain: Medium Risk (12/11/2022)   Received from [facility]   Overall Financial Resource Strain (CARDIA)    Difficulty of Paying Living Expenses: Somewhat hard  Food Insecurity: No Food Insecurity (01/15/2023)   Hunger Vital Sign    Worried About Running Out of Food in the Last Year: Never true    Ran Out of Food in the Last Year: Never true  Transportation Needs: No Transportation Needs (01/15/2023)   PRAPARE - Administrator, Civil Service (Medical): No    Lack of Transportation (Non-Medical): No  Physical Activity: Not on file  Stress: Not on file  Social Connections: Not on file  Intimate Partner Violence: Not At Risk (01/15/2023)   Humiliation, Afraid, Rape, and Kick questionnaire  Fear of Current or Ex-Partner: No    Emotionally Abused: No    Physically Abused: No    Sexually Abused: No    History reviewed. No pertinent family history.   Vitals:   01/16/23 1958 01/17/23 0000 01/17/23 0509 01/17/23 0846  BP: (!) 104/53 (!) 116/56 (!) 143/62 (!) 165/66  Pulse: (!) 59 (!) 59 (!) 59 61  Resp: 20 18 18 16   Temp: 98 F (36.7 C) 97.9 F (36.6 C) 98.2 F (36.8 C) 97.8 F (36.6 C)  TempSrc: Oral     SpO2: 97% 95% 97% 99%  Weight:   55.5 kg   Height:         PHYSICAL EXAM General: Well-appearing elderly male, well nourished, in no acute distress sitting upright in hospital bed with son at bedside. HEENT: Normocephalic and atraumatic. Neck: No JVD.  Lungs: Normal respiratory effort on room air. Clear bilaterally to auscultation. No wheezes, crackles, rhonchi.  Heart: Slow rate regular rhythm. Normal S1 and S2 without gallops or murmurs.  Abdomen: Non-distended appearing.  Msk: Normal strength and tone for age. Extremities: Warm and well perfused. No clubbing, cyanosis. No edema.  Neuro: Alert and oriented X 3. Psych: Answers questions appropriately.   Labs: Basic Metabolic Panel: Recent Labs    01/14/23 1355 01/15/23 0801 01/16/23 0243 01/17/23 0700  NA 136 133* 135 136  K 4.6 4.3 4.0 4.3  CL 103 103 104 104  CO2 26 24 26 24   GLUCOSE 129* 118* 110* 110*  BUN 28* 18 22 20   CREATININE 0.82 0.61 0.77 0.69  CALCIUM 9.0 8.7* 8.6* 8.6*  MG 2.6* 2.3  --   --    Liver Function Tests: No results for input(s): "AST", "ALT", "ALKPHOS", "BILITOT", "PROT", "ALBUMIN" in the last 72 hours. No results for input(s): "LIPASE", "AMYLASE" in the last 72 hours. CBC: Recent Labs    01/16/23 0243 01/17/23 0700  WBC 3.7* 4.8  HGB 14.7 15.1  HCT 43.6 43.9  MCV 96.2 94.0  PLT 138* 126*   Cardiac Enzymes: Recent Labs    01/14/23 1355 01/14/23 1701  TROPONINIHS 10 10   BNP: No results for input(s): "BNP" in the last 72 hours. D-Dimer: No results for input(s): "DDIMER" in the last 72 hours. Hemoglobin A1C: Recent Labs    01/14/23 1355  HGBA1C 6.2*   Fasting Lipid Panel: No results for input(s): "CHOL", "HDL", "LDLCALC", "TRIG", "CHOLHDL", "LDLDIRECT" in the last 72 hours. Thyroid Function Tests: Recent Labs    01/14/23 1355  TSH 0.826   Anemia Panel: No results for input(s): "VITAMINB12", "FOLATE", "FERRITIN", "TIBC", "IRON", "RETICCTPCT" in the last 72 hours.   Radiology: EP PPM/ICD IMPLANT  Result Date:  01/16/2023 Successful implantation of MRI compatible dual-chamber rate responsive pacemaker   ECHOCARDIOGRAM COMPLETE  Result Date: 01/15/2023    ECHOCARDIOGRAM REPORT   Patient Name:   Jerry Atkinson Date of Exam: 01/15/2023 Medical Rec #:  [id number]      Height:       65.0 in Accession #:    [id number]     Weight:       120.0 lb Date of Birth:  09/19/1930      BSA:          1.592 m Patient Age:    89 years       BP:           128/65 mmHg Patient Gender: M  HR:           53 bpm. Exam Location:  [facility] Procedure: 2D Echo, Cardiac Doppler and Color Doppler Indications:     Mitral valve disorder, other non-rheumatic I34.2  History:         Patient has no prior history of Echocardiogram examinations.                  Risk Factors:Diabetes and Hypertension.  Sonographer:     Cristela Blue Referring Phys:  [id number] CARALYN HUDSON Diagnosing Phys: Alwyn Pea MD  Sonographer Comments: Technically challenging study due to limited acoustic windows, no parasternal window and no subcostal window. Image acquisition challenging due to patient body habitus. IMPRESSIONS  1. LVH with bright speckled myocardium. Consider an infiltrative process (amyloid/sarcoid?).  2. Left ventricular ejection fraction, by estimation, is 55 to 60%. The left ventricle has normal function. The left ventricle has no regional wall motion abnormalities. There is moderate asymmetric left ventricular hypertrophy of the septal segment. Left ventricular diastolic parameters are consistent with Grade I diastolic dysfunction (impaired relaxation).  3. Right ventricular systolic function is normal. The right ventricular size is normal.  4. The mitral valve is normal in structure. Trivial mitral valve regurgitation.  5. The aortic valve is grossly normal. Aortic valve regurgitation is trivial. Aortic valve sclerosis is present, with no evidence of aortic valve stenosis. FINDINGS  Left Ventricle: Left ventricular ejection fraction, by estimation, is  55 to 60%. The left ventricle has normal function. The left ventricle has no regional wall motion abnormalities. The left ventricular internal cavity size was normal in size. There is  moderate asymmetric left ventricular hypertrophy of the septal segment. Left ventricular diastolic parameters are consistent with Grade I diastolic dysfunction (impaired relaxation). Right Ventricle: The right ventricular size is normal. No increase in right ventricular wall thickness. Right ventricular systolic function is normal. Left Atrium: Left atrial size was normal in size. Right Atrium: Right atrial size was normal in size. Pericardium: There is no evidence of pericardial effusion. Mitral Valve: The mitral valve is normal in structure. Trivial mitral valve regurgitation. MV peak gradient, 4.2 mmHg. The mean mitral valve gradient is 1.0 mmHg. Tricuspid Valve: The tricuspid valve is normal in structure. Tricuspid valve regurgitation is mild. Aortic Valve: The aortic valve is grossly normal. Aortic valve regurgitation is trivial. Aortic valve sclerosis is present, with no evidence of aortic valve stenosis. Aortic valve mean gradient measures 3.0 mmHg. Aortic valve peak gradient measures 4.8 mmHg. Aortic valve area, by VTI measures 3.48 cm. Pulmonic Valve: The pulmonic valve was normal in structure. Pulmonic valve regurgitation is not visualized. Aorta: The ascending aorta was not well visualized. IAS/Shunts: No atrial level shunt detected by color flow Doppler. Additional Comments: LVH with bright speckled myocardium. Consider an infiltrative process (amyloid/sarcoid?).  LEFT VENTRICLE PLAX 2D LVIDd:         3.90 cm   Diastology LVIDs:         2.80 cm   LV e' medial:    2.94 cm/s LV PW:         0.90 cm   LV E/e' medial:  20.1 LV IVS:        1.60 cm   LV e' lateral:   7.07 cm/s LVOT diam:     2.00 cm   LV E/e' lateral: 8.4 LV SV:         96 LV SV Index:   61 LVOT Area:     3.14 cm  RIGHT VENTRICLE RV Basal diam:  3.30 cm RV Mid  diam:    2.50 cm RV S prime:     9.46 cm/s LEFT ATRIUM             Index        RIGHT ATRIUM           Index LA diam:        3.20 cm 2.01 cm/m   RA Area:     14.10 cm LA Vol (A2C):   28.9 ml 18.15 ml/m  RA Volume:   33.70 ml  21.17 ml/m LA Vol (A4C):   21.6 ml 13.57 ml/m LA Biplane Vol: 26.3 ml 16.52 ml/m  AORTIC VALVE AV Area (Vmax):    4.06 cm AV Area (Vmean):   4.18 cm AV Area (VTI):     3.48 cm AV Vmax:           109.00 cm/s AV Vmean:          76.700 cm/s AV VTI:            0.277 m AV Peak Grad:      4.8 mmHg AV Mean Grad:      3.0 mmHg LVOT Vmax:         141.00 cm/s LVOT Vmean:        102.000 cm/s LVOT VTI:          0.307 m LVOT/AV VTI ratio: 1.11  AORTA Ao Root diam: 2.40 cm MITRAL VALVE                TRICUSPID VALVE MV Area (PHT): 3.12 cm     TR Peak grad:   11.3 mmHg MV Area VTI:   3.14 cm     TR Vmax:        168.00 cm/s MV Peak grad:  4.2 mmHg MV Mean grad:  1.0 mmHg     SHUNTS MV Vmax:       1.02 m/s     Systemic VTI:  0.31 m MV Vmean:      50.8 cm/s    Systemic Diam: 2.00 cm MV Decel Time: 243 msec MV E velocity: 59.20 cm/s MV A velocity: 112.00 cm/s MV E/A ratio:  0.53 Dwayne D Callwood MD Electronically signed by Alwyn Pea MD Signature Date/Time: 01/15/2023/4:27:25 PM    Final    DG Chest 2 View  Result Date: 01/14/2023 CLINICAL DATA:  Shortness of breath EXAM: CHEST - 2 VIEW COMPARISON:  None Available. FINDINGS: Hyperinflation. Chronic lung changes. No consolidation, pneumothorax or effusion. No edema. Calcified aorta. Normal cardiopericardial silhouette. Degenerative changes seen of the spine and shoulders. IMPRESSION: Hyperinflation.  Chronic changes.  No acute cardiopulmonary disease. Electronically Signed   By: Karen Kays M.D.   On: 01/14/2023 15:21    ECHO 01/15/2023:  1. LVH with bright speckled myocardium. Consider an infiltrative process (amyloid/sarcoid?).   2. Left ventricular ejection fraction, by estimation, is 55 to 60%. The left ventricle has normal function. The  left ventricle has no regional wall motion abnormalities. There is moderate asymmetric left ventricular hypertrophy of the septal segment. Left ventricular diastolic parameters are consistent with Grade I diastolic dysfunction (impaired relaxation).   3. Right ventricular systolic function is normal. The right ventricular size is normal.   4. The mitral valve is normal in structure. Trivial mitral valve regurgitation.   5. The aortic valve is grossly normal. Aortic valve regurgitation is  trivial. Aortic valve sclerosis is present, with no evidence  of aortic  valve stenosis.    TELEMETRY reviewed by me [facility]) 01/17/2023 : sinus bradycardia LBBB rate 50s  EKG reviewed by me: sinus bradycardia rate 48 bpm LBBB  Data reviewed by me [facility]) 01/17/2023: last 24h vitals tele labs imaging I/O ED provider note, admission H&P  Principal Problem:   Syncope Active Problems:   PAF (paroxysmal atrial fibrillation) (HCC)   Syncope and collapse   Bradycardia    ASSESSMENT AND PLAN:  TYKELL LINEBACK is a 89 y.o. male  with a past medical history of paroxysmal atrial fibrillation (eliquis), bradycardia, nonobstructive coronary artery disease, chronic LBBB, carotid artery stenosis, hypertension, hyperlipidemia, diabetes who presented to the ED on 01/14/2023 for syncope. Cardiology was consulted for further evaluation and management.   # Symptomatic bradycardia # Syncope Patient with episode of syncope yesterday with associated dizziness. Has had episodes of dizziness over the last year, this is the first episode of syncope he has experienced. Episode lasted less than one minute and was witnessed. Noted to be bradycardic on telemetry to the 40s. ?sinus pauses on tele, less than 3 seconds.  -Discussed the risks and benefits of proceeding with pacemaker implantation with the patient.  He is agreeable to proceed.  NPO until pacemaker implant this morning (01/16/2023) with Dr. Darrold Junker.  Written consent will be obtained.   -Keflex 500 mg bid x10 days -Continue to monitor HR closely.   # Paroxysmal atrial fibrillation Patient with hx of pAF on long-term anticoagulation with Eliquis. Last dose this AM. Currently in sinus on telemetry.  -Hold eliquis for pacemaker implant tomorrow. Will likely plan to restart Sunday.   # Coronary artery disease # Carotid artery stenosis # Hyperlipidemia # Hypertension Patient with history of remote LHC which showed nonobstructive CAD. CTA neck 2019 with significant carotid stenosis, previously followed by vascular surgery. Has been holding simvastatin for 3 weeks due to leg weakness but feels symptoms have not improved off this medication.  -Continue simvastatin. -Continue aspirin 81 mg daily.  -Continue home ramipril 10 mg daily.    Signed: Clotilde Dieter, DO  01/17/2023, 1:27 PM [facility]

## 2023-01-19 NOTE — Group Note (Deleted)

## 2023-01-19 NOTE — Group Note (Deleted)

## 2023-01-20 ENCOUNTER — Ambulatory Visit: Payer: Medicare Other | Admitting: Dermatology

## 2023-03-10 ENCOUNTER — Ambulatory Visit: Payer: Medicare Other | Admitting: Dermatology

## 2023-03-10 ENCOUNTER — Encounter: Payer: Self-pay | Admitting: Dermatology

## 2023-03-10 DIAGNOSIS — D492 Neoplasm of unspecified behavior of bone, soft tissue, and skin: Secondary | ICD-10-CM | POA: Diagnosis not present

## 2023-03-10 DIAGNOSIS — L820 Inflamed seborrheic keratosis: Secondary | ICD-10-CM

## 2023-03-10 DIAGNOSIS — W908XXA Exposure to other nonionizing radiation, initial encounter: Secondary | ICD-10-CM | POA: Diagnosis not present

## 2023-03-10 DIAGNOSIS — L570 Actinic keratosis: Secondary | ICD-10-CM | POA: Diagnosis not present

## 2023-03-10 DIAGNOSIS — Z872 Personal history of diseases of the skin and subcutaneous tissue: Secondary | ICD-10-CM

## 2023-03-10 DIAGNOSIS — Z85828 Personal history of other malignant neoplasm of skin: Secondary | ICD-10-CM | POA: Diagnosis not present

## 2023-03-10 NOTE — Progress Notes (Signed)
Follow-Up Visit   Subjective  Jerry Atkinson is a 89 y.o. male who presents for the following: Spots on face and neck. Hx of BCC and AKs. Specific lesion on left jaw line. Dur: <6 months. Was bleeding and draining. Will not resolve.   The patient has spots, moles and lesions to be evaluated, some may be new or changing and the patient may have concern these could be cancer.  Son, Tammy Sours, is with patient and contributes to history.   The following portions of the chart were reviewed this encounter and updated as appropriate: medications, allergies, medical history  Review of Systems:  No other skin or systemic complaints except as noted in HPI or Assessment and Plan.  Objective  Well appearing patient in no apparent distress; mood and affect are within normal limits.  A focused examination was performed of the following areas: Face, neck, ears  Relevant physical exam findings are noted in the Assessment and Plan.  Left Submandibular Area 8 mm ulcerated crusted papule       Left cheek x5 (5) Erythematous thin papules/macules with gritty scale.     Assessment & Plan   Neoplasm of skin Left Submandibular Area  Skin / nail biopsy Type of biopsy: tangential   Informed consent: discussed and consent obtained   Timeout: patient name, date of birth, surgical site, and procedure verified   Procedure prep:  Patient was prepped and draped in usual sterile fashion Prep type:  Isopropyl alcohol Anesthesia: the lesion was anesthetized in a standard fashion   Anesthetic:  1% lidocaine w/ epinephrine 1-100,000 buffered w/ 8.4% NaHCO3 Instrument used: DermaBlade   Hemostasis achieved with: pressure and aluminum chloride   Outcome: patient tolerated procedure well   Post-procedure details: sterile dressing applied and wound care instructions given   Dressing type: bandage and petrolatum    Specimen 1 - Surgical pathology Differential Diagnosis: R/O SCC  Check Margins:  No  Discussed Mohs surgery. Patient prefers to not travel. Consider treatment here pending pathology results.   AK (actinic keratosis) (5) Left cheek x5  Actinic keratoses are precancerous spots that appear secondary to cumulative UV radiation exposure/sun exposure over time. They are chronic with expected duration over 1 year. A portion of actinic keratoses will progress to squamous cell carcinoma of the skin. It is not possible to reliably predict which spots will progress to skin cancer and so treatment is recommended to prevent development of skin cancer.  Recommend daily broad spectrum sunscreen SPF 30+ to sun-exposed areas, reapply every 2 hours as needed.  Recommend staying in the shade or wearing long sleeves, sun glasses (UVA+UVB protection) and wide brim hats (4-inch brim around the entire circumference of the hat). Call for new or changing lesions.  Destruction of lesion - Left cheek x5 (5) Complexity: simple   Destruction method: cryotherapy   Informed consent: discussed and consent obtained   Timeout:  patient name, date of birth, surgical site, and procedure verified Lesion destroyed using liquid nitrogen: Yes   Region frozen until ice ball extended beyond lesion: Yes   Cryo cycles: 1 or 2. Outcome: patient tolerated procedure well with no complications   Post-procedure details: wound care instructions given   Additional details:  Prior to procedure, discussed risks of blister formation, small wound, skin dyspigmentation, or rare scar following cryotherapy. Recommend Vaseline ointment to treated areas while healing.    Return in about 3 months (around 06/10/2023) for AK Follow Up.  I, Lawson Radar, CMA, am acting  as scribe for Elie Goody, MD.   Documentation: I have reviewed the above documentation for accuracy and completeness, and I agree with the above.  Elie Goody, MD

## 2023-03-10 NOTE — Patient Instructions (Signed)
Cryotherapy Aftercare  Wash gently with soap and water everyday.   Apply Vaseline Jelly daily until healed.    Wound Care Instructions  Cleanse wound gently with soap and water once a day then pat dry with clean gauze. Apply a thin coat of Petrolatum (petroleum jelly, "Vaseline") over the wound (unless you have an allergy to this). We recommend that you use a new, sterile tube of Vaseline. Do not pick or remove scabs. Do not remove the yellow or white "healing tissue" from the base of the wound.  Cover the wound with fresh, clean, nonstick gauze and secure with paper tape. You may use Band-Aids in place of gauze and tape if the wound is small enough, but would recommend trimming much of the tape off as there is often too much. Sometimes Band-Aids can irritate the skin.  You should call the office for your biopsy report after 1 week if you have not already been contacted.  If you experience any problems, such as abnormal amounts of bleeding, swelling, significant bruising, significant pain, or evidence of infection, please call the office immediately.  FOR ADULT SURGERY PATIENTS: If you need something for pain relief you may take 1 extra strength Tylenol (acetaminophen) AND 2 Ibuprofen (200mg  each) together every 4 hours as needed for pain. (do not take these if you are allergic to them or if you have a reason you should not take them.) Typically, you may only need pain medication for 1 to 3 days.      Recommend daily broad spectrum sunscreen SPF 30+ to sun-exposed areas, reapply every 2 hours as needed. Call for new or changing lesions.  Staying in the shade or wearing long sleeves, sun glasses (UVA+UVB protection) and wide brim hats (4-inch brim around the entire circumference of the hat) are also recommended for sun protection.    Due to recent changes in healthcare laws, you may see results of your pathology and/or laboratory studies on MyChart before the doctors have had a chance to  review them. We understand that in some cases there may be results that are confusing or concerning to you. Please understand that not all results are received at the same time and often the doctors may need to interpret multiple results in order to provide you with the best plan of care or course of treatment. Therefore, we ask that you please give [place] 2 business days to thoroughly review all your results before contacting the office for clarification. Should we see a critical lab result, you will be contacted sooner.   If You Need Anything After Your Visit  If you have any questions or concerns for your doctor, please call our main line at [phone] and press option 4 to reach your doctor's medical assistant. If no one answers, please leave a voicemail as directed and we will return your call as soon as possible. Messages left after 4 pm will be answered the following business day.   You may also send [place] a message via MyChart. We typically respond to MyChart messages within 1-2 business days.  For prescription refills, please ask your pharmacy to contact our office. Our fax number is [phone].  If you have an urgent issue when the clinic is closed that cannot wait until the next business day, you can page your doctor at the number below.    Please note that while we do our best to be available for urgent issues outside of office hours, we are not available 24/7.  If you have an urgent issue and are unable to reach [place], you may choose to seek medical care at your doctor's office, retail clinic, urgent care center, or emergency room.  If you have a medical emergency, please immediately call 911 or go to the emergency department.  Pager Numbers  - Dr. Gwen Pounds: [phone]  - Dr. Roseanne Reno: [phone]  - Dr. Katrinka Blazing: [phone]   In the event of inclement weather, please call our main line at [phone] for an update on the status of any delays or closures.  Dermatology  Medication Tips: Please keep the boxes that topical medications come in in order to help keep track of the instructions about where and how to use these. Pharmacies typically print the medication instructions only on the boxes and not directly on the medication tubes.   If your medication is too expensive, please contact our office at [phone] option 4 or send [place] a message through MyChart.   We are unable to tell what your co-pay for medications will be in advance as this is different depending on your insurance coverage. However, we may be able to find a substitute medication at lower cost or fill out paperwork to get insurance to cover a needed medication.   If a prior authorization is required to get your medication covered by your insurance company, please allow [place] 1-2 business days to complete this process.  Drug prices often vary depending on where the prescription is filled and some pharmacies may offer cheaper prices.  The website www.goodrx.com contains coupons for medications through different pharmacies. The prices here do not account for what the cost may be with help from insurance (it may be cheaper with your insurance), but the website can give you the price if you did not use any insurance.  - You can print the associated coupon and take it with your prescription to the pharmacy.  - You may also stop by our office during regular business hours and pick up a GoodRx coupon card.  - If you need your prescription sent electronically to a different pharmacy, notify our office through [facility] or by phone at [phone] option 4.     Si Usted Necesita Algo Despus de Su Visita  Tambin puede enviarnos un mensaje a travs de Clinical cytogeneticist. Por lo general respondemos a los mensajes de MyChart en el transcurso de 1 a 2 das hbiles.  Para renovar recetas, por favor pida a su farmacia que se ponga en contacto con nuestra oficina. Annie Sable de fax es [place] [phone].  Si  tiene un asunto urgente cuando la clnica est cerrada y que no puede esperar hasta el siguiente da hbil, puede llamar/localizar a su doctor(a) al nmero que aparece a continuacin.   Por favor, tenga en cuenta que aunque hacemos todo lo posible para estar disponibles para asuntos urgentes fuera del horario de [place], no estamos disponibles las 24 horas del da, los 7 [address]  [address] de la [place].   Si tiene un problema urgente y no puede comunicarse con nosotros, puede optar por buscar atencin mdica  en el consultorio de su doctor(a), en una clnica privada, en un centro de atencin urgente o en una sala de emergencias.  Si tiene Engineer, drilling, por favor llame inmediatamente al 911 o vaya a la sala de emergencias.  Nmeros de bper  - Dr. Gwen Pounds: [phone]  - Dra. Roseanne Reno: [phone]  - Dr. Katrinka Blazing: [phone]   En caso de inclemencias del tiempo, por favor llame a Ferne Coe  lnea principal al [phone] para una actualizacin sobre el [place] de cualquier retraso o cierre.  Consejos para la medicacin en dermatologa: Por favor, guarde las cajas en las que vienen los medicamentos de uso tpico para ayudarle a seguir las instrucciones sobre dnde y cmo usarlos. Las farmacias generalmente imprimen las instrucciones del medicamento slo en las cajas y no directamente en los tubos del [place].   Si su medicamento es muy caro, por favor, pngase en contacto con Rolm Gala llamando al [phone] y presione la opcin 4 o envenos un mensaje a travs de Clinical cytogeneticist.   No podemos decirle cul ser su copago por los medicamentos por adelantado ya que esto es diferente dependiendo de la cobertura de su seguro. Sin embargo, es posible que podamos encontrar un medicamento sustituto a Audiological scientist un formulario para que el seguro cubra el medicamento que se considera necesario.   Si se requiere una autorizacin previa para que su compaa de seguros [place] su medicamento, por  favor permtanos de 1 a 2 das hbiles para completar [address]  Los precios de los medicamentos varan con frecuencia dependiendo del Environmental consultant de dnde se surte la receta y alguna farmacias pueden ofrecer precios ms baratos.  El sitio web www.goodrx.com tiene cupones para medicamentos de Health and safety inspector. Los precios aqu no tienen en cuenta lo que podra costar con la ayuda del seguro (puede ser ms barato con su seguro), pero el sitio web puede darle el precio si no utiliz Tourist information centre manager.  - Puede imprimir el cupn correspondiente y llevarlo con su receta a la farmacia.  - Tambin puede pasar por nuestra oficina durante el horario de atencin regular y Education officer, museum una tarjeta de cupones de GoodRx.  - Si necesita que su receta se enve electrnicamente a una farmacia diferente, informe a nuestra oficina a travs de MyChart de [place] o por telfono llamando al [phone] y presione la opcin 4.

## 2023-03-13 LAB — SURGICAL PATHOLOGY

## 2023-03-16 ENCOUNTER — Telehealth: Payer: Self-pay

## 2023-03-16 NOTE — Telephone Encounter (Signed)
Discussed pathology results with patient's son, [name]. Reassured the lesion is a benign SK. No further treatment needed. RTC PRN.

## 2023-03-16 NOTE — Telephone Encounter (Signed)
-----   Message from [place] sent at 03/15/2023  8:58 AM EST ----- Diagnosis: left submandibular area :       SEBORRHEIC KERATOSIS, IRRITATED    Plan: please call Luberta Robertson (in patient contacts) with diagnosis. The spot we removed from the left jawline was a benign thickening of the top layer of skin (irritated seborrheic keratosis). There was no skin cancer in the biopsy and no further treatment is needed.

## 2023-06-11 ENCOUNTER — Ambulatory Visit: Payer: Medicare Other | Admitting: Dermatology

## 2023-08-03 ENCOUNTER — Ambulatory Visit
Admission: EM | Admit: 2023-08-03 | Discharge: 2023-08-03 | Disposition: A | Discharge status: Home / Self Care | Attending: Emergency Medicine | Admitting: Emergency Medicine

## 2023-08-03 ENCOUNTER — Ambulatory Visit (INDEPENDENT_AMBULATORY_CARE_PROVIDER_SITE_OTHER)

## 2023-08-03 DIAGNOSIS — S51812A Laceration without foreign body of left forearm, initial encounter: Secondary | ICD-10-CM

## 2023-08-03 DIAGNOSIS — W19XXXA Unspecified fall, initial encounter: Secondary | ICD-10-CM | POA: Diagnosis not present

## 2023-08-03 DIAGNOSIS — M25551 Pain in right hip: Secondary | ICD-10-CM | POA: Diagnosis not present

## 2023-08-03 NOTE — ED Provider Notes (Signed)
 HPI  SUBJECTIVE:  Jerry Atkinson is a 89 y.o. male who presents with a fall while getting up from a chair 3 days ago.  Patient states that he got up too fast, felt lightheaded, and fell onto his right hip/buttock.  He now reports right hip pain that intermittently radiates into his lower back.  No preceding chest pain, shortness of breath, palpitations.  He denies hitting his head, loss of consciousness, headache, new or different neck pain, syncope causing the fall or amnesia to the event.  He has been able to walk, but has required a walker.  He has been taking Tylenol and applying ice with improvement in his symptoms.  Symptoms worse with getting up from sitting and with lying down.  He also sustained a skin tear to his left forearm that he has been keeping clean with soap and water.  He has a past medical history of paroxysmal atrial fibrillation on Eliquis, carotid atherosclerosis, diabetes with polyneuropathy, BPH, GERD, hypercholesterolemia, hypertension, syncope/bradycardia status post pacemaker, myasthenia gravis syncope, right leg weakness.  He states that the lightheadedness when he stands up rapidly has been going on for some time.  No history of chronic kidney disease, osteoporosis.  [facility].  Additional history obtained from son  Past Medical History:  Diagnosis Date   Actinic keratosis    Basal cell carcinoma 04/09/2020    R ant alar crease, EDC 06/18/20   BPH (benign prostatic hypertrophy)    Diabetes mellitus, type 2 (HCC)    GERD (gastroesophageal reflux disease)    Hypercholesteremia    Hypertension    Wears dentures    full upper   Wears hearing aid in both ears     Past Surgical History:  Procedure Laterality Date   CATARACT EXTRACTION W/PHACO Right 09/10/2015   Procedure: CATARACT EXTRACTION PHACO AND INTRAOCULAR LENS PLACEMENT (IOC);  Surgeon: Sherald Hess, MD;  Location: [facility];  Service: Ophthalmology;  Laterality: Right;   LEAVE MARTINS TOGETHER   CATARACT EXTRACTION W/PHACO Left 07/01/2021   Procedure: CATARACT EXTRACTION PHACO AND INTRAOCULAR LENS PLACEMENT (IOC) LEFT DIABETIC;  Surgeon: Nevada Crane, MD;  Location: [facility];  Service: Ophthalmology;  Laterality: Left;  Diabetic 7.49 00:53.9   PACEMAKER IMPLANT N/A 01/16/2023   Procedure: PACEMAKER IMPLANT;  Surgeon: Marcina Millard, MD;  Location: [facility];  Service: Cardiovascular;  Laterality: N/A;   REPLACEMENT TOTAL KNEE      History reviewed. No pertinent family history.  Social History   Tobacco Use   Smoking status: Never   Smokeless tobacco: Never  Vaping Use   Vaping status: Never Used  Substance Use Topics   Alcohol use: No    No current facility-administered medications for this encounter.  Current Outpatient Medications:    acetaminophen (TYLENOL) 325 MG tablet, Take by mouth., Disp: , Rfl:    ascorbic acid (VITAMIN C) 500 MG tablet, Take 500 mg by mouth daily., Disp: , Rfl:    aspirin 81 MG tablet, Take 81 mg by mouth daily., Disp: , Rfl:    Blood Glucose Calibration (ACCU-CHEK GUIDE CONTROL) LIQD, , Disp: , Rfl:    Cholecalciferol (VITAMIN D3) 50 MCG (2000 UT) capsule, Take 2,000 Units by mouth daily., Disp: , Rfl:    cyanocobalamin 1000 MCG tablet, Take 1,000 mcg by mouth daily., Disp: , Rfl:    ELIQUIS 5 MG TABS tablet, 5 mg 2 (two) times daily., Disp: , Rfl:    finasteride (PROSCAR) 5 MG tablet,  Take 5 mg by mouth daily., Disp: , Rfl:    gabapentin (NEURONTIN) 100 MG capsule, Take 200 mg by mouth 2 (two) times daily., Disp: , Rfl:    glimepiride (AMARYL) 1 MG tablet, Take 1 mg by mouth daily., Disp: , Rfl:    Multiple Vitamins-Minerals (PRESERVISION AREDS 2 PO), Take 1 capsule by mouth daily., Disp: , Rfl:    oxybutynin (DITROPAN-XL) 10 MG 24 hr tablet, Take 10 mg by mouth daily., Disp: , Rfl:    ramipril (ALTACE) 10 MG capsule, Take by mouth., Disp: , Rfl:    sertraline (ZOLOFT) 50 MG tablet,  Take 1 tablet by mouth daily., Disp: , Rfl:    tamsulosin (FLOMAX) 0.4 MG CAPS capsule, Take 0.8 mg by mouth daily., Disp: , Rfl:    simvastatin (ZOCOR) 20 MG tablet, Take 20 mg by mouth daily., Disp: , Rfl:   No Known Allergies   ROS  As noted in HPI.   Physical Exam  BP (!) 189/70 (BP Location: Right Arm)   Pulse 67   Temp 97.7 F (36.5 C) (Oral)   Resp 15   SpO2 100%  BP Readings from Last 3 Encounters:  08/03/23 (!) 189/70  01/17/23 (!) 165/66  09/18/22 (!) 143/71    Constitutional: Well developed, well nourished, no acute distress Eyes: PERRL, EOMI, conjunctiva normal bilaterally HENT: Normocephalic, atraumatic,mucus membranes moist Respiratory: Clear to auscultation bilaterally, no rales, no wheezing, no rhonchi Cardiovascular: Normal rate and rhythm, no murmurs, no gallops, no rubs GI: Soft, nondistended, normal bowel sounds, nontender, no rebound, no guarding Back: no CVAT skin: 4 cm skin tear left forearm with no evidence of infection    Musculoskeletal: No bruising over right lower back, right buttock or hip. No tenderness over L-spine, sacrum, SI joint.  Tenderness over lateral right hip and buttock at the sciatic notch.  Patient able to bear weight in the department. No signs of trauma R hip. No bruising, erythema, rash. No  tenderness down IT band, quadriceps. No pain/ with passive abduction/adduction of leg. No pain with int/ext rotation hip. No tenderness at sciatic notch.  Flexion/extension knee WNL. Knee joint NT, stable. Motor strength flexion/ext hip 5/5. Sensation to LT intact. PT 2+  Neurologic: Alert & oriented x 3, CN III-XII grossly intact, no motor deficits, sensation grossly intact Psychiatric: Speech and behavior appropriate   ED Course   Medications - No data to display  Orders Placed This Encounter  Procedures   DG Hip Unilat With Pelvis 2-3 Views Right    Standing Status:   Standing    Number of Occurrences:   1    Symptom/Reason for  Exam:   Hip pain, right [[id number]]    Call Results- Best Contact Number?:   Fall 3 days ago, right lateral hip tenderness, rule out fracture   Apply dressing    Standing Status:   Standing    Number of Occurrences:   1   No results found for this or any previous visit (from the past 24 hours). DG Hip Unilat With Pelvis 2-3 Views Right Result Date: 08/03/2023 CLINICAL DATA:  Right hip pain. EXAM: DG HIP (WITH OR WITHOUT PELVIS) 2-3V RIGHT COMPARISON:  None Available. FINDINGS: No acute fracture or dislocation. There is advanced osteopenia. Indeterminate area of heterogeneity in the proximal right femoral diaphysis with mild cortical thickening may be related to prior fracture or infectious process. An infiltrative or neoplastic process is not excluded. Clinical correlation recommended. Direct comparison with prior images, if available,  or MRI may provide better evaluation. The soft tissues are unremarkable. IMPRESSION: 1. No acute fracture or dislocation. 2. Indeterminate area of heterogeneity in the proximal right femoral diaphysis. Electronically Signed   By: Elgie Collard M.D.   On: 08/03/2023 12:15    ED Clinical Impression  1. Acute right hip pain   2. Fall, initial encounter   3. Skin tear of left forearm without complication, initial encounter      ED Assessment/Plan     1.  Right hip pain post fall.  Will x-ray hip.  Deferring imaging of the L-spine as he has no bony tenderness there.  I believe his low back pain is radiating from his hip.  Reviewed imaging independently.  No obvious pelvic or hip fracture.. Will call son, [name] at [phone] if radiology overread differs enough from mine and we need to change management.  Reviewed radiology report.  No fracture or dislocation consistent with my read.  Advanced osteopenia.  Indeterminant area of heterogeneity in the proximal right femoral diaphysis with mild cortical thickening cannot rule out infiltrative or neoplastic process.  See radiology report for full details.  Hip x-ray negative for any acute changes.  Cannot rule out infiltrative or neoplastic process per radiology.  Discussed this with son over the phone.  He will follow-up with PCP or with orthopedics in 2 to 3 weeks if symptoms persist for reevaluation and possible advanced imaging.   Continue Tylenol, ice.  Follow-up with PCP as needed.  ER return precautions given the patient and son  2.  Skin tear.  No evidence of infection.  Had this cleaned, and I reapproximated edges with Steri-Strips.  Discussed imaging, MDM, treatment plan, and plan for follow-up with family Discussed sn/sx that should prompt return to the ED. family agrees with plan.   No orders of the defined types were placed in this encounter.     *This clinic note was created using Dragon dictation software. Therefore, there may be occasional mistakes despite careful proofreading. ?    Domenick Gong, MD 08/04/23 1131

## 2023-08-03 NOTE — ED Triage Notes (Signed)
 Patient states that he fell getting out of a chair on 2-33. He's having lower right side back-buttocks  pain. Patient scraped his left forearm. He's on eliquis

## 2023-08-03 NOTE — Discharge Instructions (Signed)
 I did not appreciate any obvious fracture on x-ray, but we will contact you if the radiology overread differs enough from mine and we need to change management.  Tylenol 500 to 975 mg 3 times a day will help with pain.  Continue ice.  Nonstick dressing to help protect the Steri-Strips.  Keep the Steri-Strips on as long as you can.  It to the ER for any change in mental status, headache, worsening symptoms, and inability to walk, or other concerns

## 2023-08-06 ENCOUNTER — Encounter: Payer: Self-pay | Admitting: Emergency Medicine

## 2023-08-06 ENCOUNTER — Other Ambulatory Visit: Payer: Self-pay

## 2023-08-06 ENCOUNTER — Emergency Department
Admission: EM | Admit: 2023-08-06 | Discharge: 2023-08-06 | Disposition: A | Discharge status: Home / Self Care | Attending: Emergency Medicine | Admitting: Emergency Medicine

## 2023-08-06 DIAGNOSIS — E119 Type 2 diabetes mellitus without complications: Secondary | ICD-10-CM | POA: Insufficient documentation

## 2023-08-06 DIAGNOSIS — M545 Low back pain, unspecified: Secondary | ICD-10-CM | POA: Insufficient documentation

## 2023-08-06 DIAGNOSIS — I10 Essential (primary) hypertension: Secondary | ICD-10-CM | POA: Insufficient documentation

## 2023-08-06 MED ORDER — METHOCARBAMOL 500 MG PO TABS: 1000.0000 mg | ORAL_TABLET | Freq: Three times a day (TID) | ORAL | 0 refills | Status: AC

## 2023-08-06 MED ORDER — HYDROCODONE-ACETAMINOPHEN 5-325 MG PO TABS
1.0000 | ORAL_TABLET | Freq: Four times a day (QID) | ORAL | 0 refills | Status: AC | PRN
Start: 2023-08-06 — End: 2024-08-05

## 2023-08-06 NOTE — ED Notes (Signed)
 See triage note  Presents with cont'd back pain s/p fall on Monday

## 2023-08-06 NOTE — ED Provider Notes (Signed)
 [facility] Provider Note    Event Date/Time   First MD Initiated Contact with Patient 08/06/23 [phone]     (approximate)   History   Back Pain   HPI  Jerry Atkinson is a 89 y.o. male with PMH of hypertension, type 2 diabetes and BPH who presents for evaluation of lower back pain.  Patient had a fall on Monday and was seen at urgent care with negative right hip x-ray.  Patient is returning to the emergency department today because his pain has worsened.  Denies radiation down the legs, numbness, tingling.  No changes in bladder or bowel function.  No saddle anesthesia.  No fevers.      Physical Exam   Triage Vital Signs: ED Triage Vitals  Encounter Vitals Group     BP 08/06/23 0852 (!) 182/71     Systolic BP Percentile --      Diastolic BP Percentile --      Pulse Rate 08/06/23 0852 60     Resp 08/06/23 0852 20     Temp 08/06/23 0853 97.9 F (36.6 C)     Temp Source 08/06/23 0853 Oral     SpO2 08/06/23 0852 97 %     Weight 08/06/23 0852 134 lb (60.8 kg)     Height 08/06/23 0852 5\' 5"  (1.651 m)     Head Circumference --      Peak Flow --      Pain Score 08/06/23 0851 7     Pain Loc --      Pain Education --      Exclude from Growth Chart --     Most recent vital signs: Vitals:   08/06/23 0852 08/06/23 0853  BP: (!) 182/71   Pulse: 60   Resp: 20   Temp:  97.9 F (36.6 C)  SpO2: 97%    General: Awake, no distress.  CV:  Good peripheral perfusion.  RRR. Resp:  Normal effort.  CTAB. Abd:  No distention.  Other:  No tenderness to palpation over the vertebral spines, mild tenderness to palpation across the lower back muscles.  5/5 strength in bilateral lower extremities.  Sensation intact across all dermatomes of the lower extremities.  Dorsalis pedis pulses 2+ and regular.   ED Results / Procedures / Treatments   Labs (all labs ordered are listed, but only abnormal results are displayed) Labs Reviewed - No data to  display    PROCEDURES:  Critical Care performed: No  Procedures   MEDICATIONS ORDERED IN ED: Medications - No data to display   IMPRESSION / MDM / ASSESSMENT AND PLAN / ED COURSE  I reviewed the triage vital signs and the nursing notes.                             89 year old male presents for evaluation of worsening back pain.  Blood pressure is elevated but patient has history of hypertension and is a bit anxious on exam.  Vital signs stable otherwise.   Differential diagnosis includes, but is not limited to, occult hip fracture, muscle strain, lumbar radiculopathy.  Patient's presentation is most consistent with acute, uncomplicated illness.  Patient is able to walk and does not have any tenderness in the hip on exam.  Do not feel that further imaging is needed at this time.  Will treat patient with pain medication and muscle relaxer.  Patient's son pulled me aside to discuss some  of the patient's anxiety about his health.  X-rays of the hip that were completed at urgent care did not show any acute fractures but there was an area of heterogenicity on the femur.  Radiologist commented that they could not rule out malignancy.  The patient has been anxious since the urgent care visit and is concerned that he may have cancer based on these imaging findings.  Patient denies any B symptoms.  And only began having pain in the hip after the fall.  I explained to the patient that his presentation is very reassuring and I do not feel that there is any need for emergent MRI at this time.  I explained that he can follow-up with orthopedics if his pain continues after 3 weeks and at that point he could have advanced imaging if needed.  Patient stated that he felt much better after reassurance regarding this.  We discussed taking it easy on his daily exercise for the next couple days to allow him to recover.  I encouraged him to continue using the walker at home.  Patient was agreeable to plan,  voiced understanding and was stable at discharge.      FINAL CLINICAL IMPRESSION(S) / ED DIAGNOSES   Final diagnoses:  Acute bilateral low back pain without sciatica     Rx / DC Orders   ED Discharge Orders          Ordered    HYDROcodone-acetaminophen (NORCO/VICODIN) 5-325 MG tablet  Every 6 hours PRN        08/06/23 1033    methocarbamol (ROBAXIN) 500 MG tablet  3 times daily        08/06/23 1033             Note:  This document was prepared using Dragon voice recognition software and may include unintentional dictation errors.   Cameron Ali, PA-C 08/06/23 1036    Minna Antis, MD 08/06/23 1357

## 2023-08-06 NOTE — Discharge Instructions (Addendum)
 I believe the back pain is a result of the fall.  This should improve with time.  If you continue to have pain past 3 weeks please schedule follow-up appointment with Dr. Allena Katz.  He is an orthopedic doctor.  He can determine if you need to have further imaging at that point.  Return to the emergency department if you have worsening pain, loss of bladder and bowel control, numbness in your groin or loss of sensation in the legs.  I have sent 2 medications to the pharmacy.  1 is a pain medication called Norco.  This can be taken every 6 hours as needed for severe pain.  This medication also contains Tylenol.  You can take Tylenol or Norco but do not take both at the same time.  You can have 650 mg of Tylenol every 6 hours as needed for mild to moderate pain.  The other medication is a muscle relaxer called Robaxin.  This can be taken every 8 hours as needed for muscle spasms.  Both of these medications are sedating, so do not drive after taking them.   I also encourage you to use topical pain relievers like Voltaren gel, muscle creams, ice and heat.  Take it easy in terms of your exercise for the next couple days to allow your body to heal and recover.  Once you start to feel better you can get back to your normal exercise routine.  It was a pleasure to care for you today!  I hope you feel better soon!

## 2023-08-06 NOTE — ED Triage Notes (Signed)
 Patient states he fell on Monday; was seen at [facility] but reports back pain has worsened.

## 2024-01-12 ENCOUNTER — Ambulatory Visit
